# Patient Record
Sex: Female | Born: 1960 | Race: White | Hispanic: No | Marital: Married | Smoking: Current every day smoker
Health system: Southern US, Community
[De-identification: ages and names within clinical notes are randomized; demographics above are authoritative.]

## PROBLEM LIST (undated history)

## (undated) DIAGNOSIS — K219 Gastro-esophageal reflux disease without esophagitis: Secondary | ICD-10-CM

## (undated) HISTORY — DX: Gastro-esophageal reflux disease without esophagitis: K21.9

## (undated) HISTORY — PX: VARICOSE VEIN SURGERY: SHX832

---

## 1998-06-21 ENCOUNTER — Other Ambulatory Visit: Admission: RE | Admit: 1998-06-21 | Discharge: 1998-06-21 | Payer: Self-pay | Admitting: Obstetrics and Gynecology

## 1999-02-24 ENCOUNTER — Emergency Department (HOSPITAL_COMMUNITY): Admission: EM | Admit: 1999-02-24 | Discharge: 1999-02-24 | Payer: Self-pay | Admitting: Emergency Medicine

## 1999-09-06 ENCOUNTER — Other Ambulatory Visit: Admission: RE | Admit: 1999-09-06 | Discharge: 1999-09-06 | Payer: Self-pay | Admitting: Obstetrics and Gynecology

## 2004-06-26 LAB — CONVERTED CEMR LAB: Pap Smear: NORMAL

## 2006-03-26 ENCOUNTER — Encounter: Payer: Self-pay | Admitting: Emergency Medicine

## 2007-12-17 ENCOUNTER — Ambulatory Visit: Payer: Self-pay | Admitting: Family Medicine

## 2007-12-17 DIAGNOSIS — Z716 Tobacco abuse counseling: Secondary | ICD-10-CM | POA: Insufficient documentation

## 2007-12-17 DIAGNOSIS — F17200 Nicotine dependence, unspecified, uncomplicated: Secondary | ICD-10-CM

## 2007-12-17 DIAGNOSIS — K219 Gastro-esophageal reflux disease without esophagitis: Secondary | ICD-10-CM | POA: Insufficient documentation

## 2007-12-30 ENCOUNTER — Encounter: Payer: Self-pay | Admitting: Family Medicine

## 2007-12-30 ENCOUNTER — Other Ambulatory Visit: Admission: RE | Admit: 2007-12-30 | Discharge: 2007-12-30 | Payer: Self-pay | Admitting: Family Medicine

## 2007-12-30 ENCOUNTER — Ambulatory Visit: Payer: Self-pay | Admitting: Family Medicine

## 2007-12-30 DIAGNOSIS — I8390 Asymptomatic varicose veins of unspecified lower extremity: Secondary | ICD-10-CM

## 2007-12-31 LAB — CONVERTED CEMR LAB
ALT: 16 units/L (ref 0–35)
AST: 19 units/L (ref 0–37)
Albumin: 3.6 g/dL (ref 3.5–5.2)
Alkaline Phosphatase: 62 units/L (ref 39–117)
BUN: 13 mg/dL (ref 6–23)
Basophils Absolute: 0 10*3/uL (ref 0.0–0.1)
Basophils Relative: 0.3 % (ref 0.0–1.0)
Bilirubin, Direct: 0.1 mg/dL (ref 0.0–0.3)
CO2: 28 meq/L (ref 19–32)
Calcium: 9.3 mg/dL (ref 8.4–10.5)
Chloride: 106 meq/L (ref 96–112)
Cholesterol: 191 mg/dL (ref 0–200)
Creatinine, Ser: 0.8 mg/dL (ref 0.4–1.2)
Eosinophils Absolute: 0.7 10*3/uL — ABNORMAL HIGH (ref 0.0–0.6)
Eosinophils Relative: 6.7 % — ABNORMAL HIGH (ref 0.0–5.0)
GFR calc Af Amer: 98 mL/min
GFR calc non Af Amer: 81 mL/min
Glucose, Bld: 103 mg/dL — ABNORMAL HIGH (ref 70–99)
HCT: 43.2 % (ref 36.0–46.0)
HDL: 52.4 mg/dL (ref >39.0)
Hemoglobin: 14.2 g/dL (ref 12.0–15.0)
LDL Cholesterol: 128 mg/dL — ABNORMAL HIGH (ref 0–99)
Lymphocytes Relative: 29 % (ref 12.0–46.0)
MCHC: 32.9 g/dL (ref 30.0–36.0)
MCV: 89.9 fL (ref 78.0–100.0)
Monocytes Absolute: 0.7 10*3/uL (ref 0.2–0.7)
Monocytes Relative: 6.6 % (ref 3.0–11.0)
Neutro Abs: 5.9 10*3/uL (ref 1.4–7.7)
Neutrophils Relative %: 57.4 % (ref 43.0–77.0)
Platelets: 268 10*3/uL (ref 150–400)
Potassium: 4 meq/L (ref 3.5–5.1)
RBC: 4.81 M/uL (ref 3.87–5.11)
RDW: 12.7 % (ref 11.5–14.6)
Sodium: 140 meq/L (ref 135–145)
TSH: 1.49 microintl units/mL (ref 0.35–5.50)
Total Bilirubin: 0.7 mg/dL (ref 0.3–1.2)
Total CHOL/HDL Ratio: 3.6
Total Protein: 6.2 g/dL (ref 6.0–8.3)
Triglycerides: 52 mg/dL (ref 0–149)
VLDL: 10 mg/dL (ref 0–40)
WBC: 10.3 10*3/uL (ref 4.5–10.5)

## 2008-01-04 ENCOUNTER — Encounter (INDEPENDENT_AMBULATORY_CARE_PROVIDER_SITE_OTHER): Payer: Self-pay | Admitting: *Deleted

## 2008-01-04 LAB — CONVERTED CEMR LAB: Pap Smear: NORMAL

## 2008-06-15 ENCOUNTER — Encounter: Payer: Self-pay | Admitting: Family Medicine

## 2008-12-12 ENCOUNTER — Encounter: Payer: Self-pay | Admitting: Family Medicine

## 2009-12-21 ENCOUNTER — Encounter: Payer: Self-pay | Admitting: Family Medicine

## 2010-12-25 NOTE — Letter (Signed)
Summary: [facility]  [facility]   Imported By: Lanelle Bal 12/27/2009 14:17:11  _____________________________________________________________________  External Attachment:    Type:   Image     Comment:   External Document

## 2017-01-01 ENCOUNTER — Ambulatory Visit (INDEPENDENT_AMBULATORY_CARE_PROVIDER_SITE_OTHER)
Admission: RE | Admit: 2017-01-01 | Discharge: 2017-01-01 | Disposition: A | Discharge status: Home / Self Care | Payer: BC Managed Care – PPO | Source: Ambulatory Visit | Attending: Primary Care | Admitting: Primary Care

## 2017-01-01 ENCOUNTER — Encounter: Payer: Self-pay | Admitting: Primary Care

## 2017-01-01 ENCOUNTER — Ambulatory Visit (INDEPENDENT_AMBULATORY_CARE_PROVIDER_SITE_OTHER): Payer: BC Managed Care – PPO | Admitting: Primary Care

## 2017-01-01 VITALS — BP 148/94 | HR 77 | Temp 98.3°F | Ht 65.5 in | Wt 183.3 lb

## 2017-01-01 DIAGNOSIS — K219 Gastro-esophageal reflux disease without esophagitis: Secondary | ICD-10-CM | POA: Diagnosis not present

## 2017-01-01 DIAGNOSIS — R05 Cough: Secondary | ICD-10-CM | POA: Diagnosis not present

## 2017-01-01 DIAGNOSIS — R030 Elevated blood-pressure reading, without diagnosis of hypertension: Secondary | ICD-10-CM

## 2017-01-01 DIAGNOSIS — I10 Essential (primary) hypertension: Secondary | ICD-10-CM | POA: Insufficient documentation

## 2017-01-01 DIAGNOSIS — R053 Chronic cough: Secondary | ICD-10-CM | POA: Insufficient documentation

## 2017-01-01 DIAGNOSIS — F17200 Nicotine dependence, unspecified, uncomplicated: Secondary | ICD-10-CM

## 2017-01-01 NOTE — Progress Notes (Signed)
Subjective:    Patient ID: Janeece Fitting, female    DOB: 1961/06/15, 56 y.o.   MRN: [id number]  HPI  Ms. Merced is a 56 year old female who presents today to establish care and discuss the problems mentioned below. Will obtain old records.  1) Cough: Current smoker of 1 PPD for the past 40 years. Her cough has been present for the past 6 months. Her cough is mostly throughout the day without cough at night. Her cough is non productive. She denies fevers, sore throat, unexplained weight loss, night sweats, wheezing, shortness of breath (exertion or rest). She takes benadryl several days weekly with improvement. She's tried taking Allegra and Zyrtec without improvement. She denies wheezing, shortness of breath. Her cough is worse with dust, fragrances, foods (crunchy foods), smoking.  2) Elevated Blood Pressure Reading: BP of 148/94 in the clinic today. She checks her BP regularly at home which runs 130's/70's on average. She denies chest pain, dizziness, headaches.  3) GERD: Long history of with symptoms of esophageal burning. She takes Zantac 75 mg once daily, sometimes 1/2 tablet twice daily with improvement in her esophageal burning. She rarely experiences symptoms of esophageal burning while on Zantac.  Review of Systems  Constitutional: Negative for fatigue, fever and unexpected weight change.  HENT: Negative for congestion and sore throat.   Respiratory: Positive for cough. Negative for shortness of breath and wheezing.   Gastrointestinal:       GERD  Allergic/Immunologic: Positive for environmental allergies.       Past Medical History:  Diagnosis Date  . GERD (gastroesophageal reflux disease)      Social History   Social History  . Marital status: Married    Spouse name: N/A  . Number of children: N/A  . Years of education: N/A   Occupational History  . Not on file.   Social History Main Topics  . Smoking status: Current Every Day Smoker    Packs/day: 1.00  .  Smokeless tobacco: Never Used  . Alcohol use Yes  . Drug use: Unknown  . Sexual activity: Not on file   Other Topics Concern  . Not on file   Social History Narrative   Married.   1 child.   Works as a Pharmacist, hospital.   Enjoys farming, shows dogs.    Past Surgical History:  Procedure Laterality Date  . VARICOSE VEIN SURGERY      Family History  Problem Relation Age of Onset  . Melanoma Mother   . Appendicitis Mother     Cancerous  . Pulmonary embolism Father   . Hypertension Father   . Ovarian cancer Maternal Grandmother   . Multiple myeloma Maternal Grandfather     No Known Allergies  No current outpatient prescriptions on file prior to visit.   No current facility-administered medications on file prior to visit.     BP (!) 148/94   Pulse 77   Temp 98.3 F (36.8 C) (Oral)   Ht 5' 5.5" (1.664 m)   Wt 183 lb 4.8 oz (83.1 kg)   SpO2 97%   BMI 30.04 kg/m    Objective:   Physical Exam  Constitutional: She appears well-nourished.  Neck: Neck supple.  Cardiovascular: Normal rate and regular rhythm.   Pulmonary/Chest: Effort normal and breath sounds normal. She has no wheezes. She has no rales.  Skin: Skin is warm and dry.  Psychiatric: She has a normal mood and affect.  Assessment & Plan:

## 2017-01-01 NOTE — Assessment & Plan Note (Signed)
Overall controlled on Zantac 75 mg, could be contributing to chronic cough. Will have her increase to 150 mg daily. Will call in one month for update on cough.

## 2017-01-01 NOTE — Patient Instructions (Signed)
Increase Zantac to 150 mg once daily for acid reflux.   Consider starting fexofenadine (Allegra) tablets for allergies. Take this daily.  Complete xray(s) prior to leaving today. I will notify you of your results once received.  We will check on you in 1 month for an update. Please call sooner if your symptoms persist.  It was a pleasure to meet you today! Please don't hesitate to call me with any questions. Welcome to [facility]!

## 2017-01-01 NOTE — Progress Notes (Signed)
Pre visit review using our clinic review tool, if applicable. No additional management support is needed unless otherwise documented below in the visit note. 

## 2017-01-01 NOTE — Assessment & Plan Note (Signed)
Smoker of 1 PPD for the past 40 years. Not ready to quit.

## 2017-01-01 NOTE — Assessment & Plan Note (Signed)
Above goal in office today, home readings stable. Continue to monitor.

## 2017-01-01 NOTE — Assessment & Plan Note (Signed)
Ongoing x 6 months. No respiratory symptoms of dyspnea, congestion, wheezing. Exam today unremarkable.  Symptoms could be related to three things: COPD/emphysema, environmental allergies, GERD. Will increase Zantac to 150 mg. Chest xray pending. Will have her take benadryl daily.  Will call in 1 month for an update. If no improvement then consider PFT's.

## 2017-01-29 ENCOUNTER — Telehealth: Payer: Self-pay | Admitting: Primary Care

## 2017-01-29 NOTE — Telephone Encounter (Signed)
Message left for patient to return my call.  

## 2017-01-29 NOTE — Telephone Encounter (Signed)
-----   Message from Doreene NestKatherine K Clark, NP sent at 01/01/2017  3:53 PM EST ----- Regarding: Cough Any improvement in cough since increasing Zantac to 150 mg and taking benadryl daily?

## 2017-02-04 NOTE — Telephone Encounter (Signed)
Message left for patient to return my call.  

## 2019-07-02 ENCOUNTER — Other Ambulatory Visit: Payer: Self-pay

## 2019-07-02 ENCOUNTER — Emergency Department
Admission: EM | Admit: 2019-07-02 | Discharge: 2019-07-02 | Disposition: A | Discharge status: Home / Self Care | Payer: BC Managed Care – PPO | Attending: Emergency Medicine | Admitting: Emergency Medicine

## 2019-07-02 DIAGNOSIS — I10 Essential (primary) hypertension: Secondary | ICD-10-CM | POA: Diagnosis not present

## 2019-07-02 DIAGNOSIS — F17200 Nicotine dependence, unspecified, uncomplicated: Secondary | ICD-10-CM | POA: Insufficient documentation

## 2019-07-02 DIAGNOSIS — R030 Elevated blood-pressure reading, without diagnosis of hypertension: Secondary | ICD-10-CM | POA: Diagnosis present

## 2019-07-02 MED ORDER — AMLODIPINE BESYLATE 5 MG PO TABS
5.0000 mg | ORAL_TABLET | Freq: Once | ORAL | Status: AC
  Administered 2019-07-02: 5 mg via ORAL
  Filled 2019-07-02: qty 1

## 2019-07-02 MED ORDER — AMLODIPINE BESYLATE 5 MG PO TABS: 5.0000 mg | ORAL_TABLET | Freq: Every day | ORAL | 3 refills | Status: DC

## 2019-07-02 NOTE — ED Provider Notes (Signed)
[facility] Emergency Department Provider Note  ____________________________________________  Time seen: Approximately 7:15 PM  I have reviewed the triage vital signs and the nursing notes.   HISTORY  Chief Complaint Hypertension    HPI Jenna Graham is a 58 y.o. female with a history of GERD, presents to the emergency department [facility] due to hypertension noted at triage.  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  Patient denies headache, chest pain, chest tightness, shortness of breath or abdominal pain.  Patient states that she walks daily and tries to maintain a healthy lifestyle.  Patient states that she is not here for basic labs and would like to be started on blood pressure medication.  She has good follow-up with primary care.  No other alleviating measures have been attempted.        Past Medical History:  Diagnosis Date  . GERD (gastroesophageal reflux disease)     Patient Active Problem List   Diagnosis Date Noted  . Chronic cough 01/01/2017  . Elevated blood pressure reading 01/01/2017  . TOBACCO USE 12/17/2007  . GERD (gastroesophageal reflux disease) 12/17/2007    Past Surgical History:  Procedure Laterality Date  . VARICOSE VEIN SURGERY      Prior to Admission medications   Medication Sig Start Date End Date Taking? Authorizing Provider  amLODipine (NORVASC) 5 MG tablet Take 1 tablet (5 mg total) by mouth daily. 07/02/19 08/01/19  Lannie Fields, PA-C    Allergies Patient has no known allergies.  Family History  Problem Relation Age of Onset  . Melanoma Mother   . Appendicitis Mother        Cancerous  . Pulmonary embolism Father   . Hypertension Father   . Ovarian cancer Maternal Grandmother   . Multiple myeloma Maternal Grandfather     Social  History Social History   Tobacco Use  . Smoking status: Current Every Day Smoker    Packs/day: 1.00  . Smokeless tobacco: Never Used  Substance Use Topics  . Alcohol use: Yes  . Drug use: Not on file     Review of Systems  Constitutional: No fever/chills Eyes: No visual changes. No discharge ENT: No upper respiratory complaints. Cardiovascular: no chest pain. Respiratory: no cough. No SOB. Gastrointestinal: No abdominal pain.  No nausea, no vomiting.  No diarrhea.  No constipation. Musculoskeletal: Negative for musculoskeletal pain. Skin: Negative for rash, abrasions, lacerations, ecchymosis. Neurological: Negative for headaches, focal weakness or numbness.  ____________________________________________   PHYSICAL EXAM:  VITAL SIGNS: ED Triage Vitals  Enc Vitals Group     BP 07/02/19 1853 (!) 187/84     Pulse Rate 07/02/19 1853 85     Resp 07/02/19 1853 18     Temp 07/02/19 1853 99.3 F (37.4 C)     Temp src --      SpO2 07/02/19 1853 100 %     Weight 07/02/19 1854 200 lb (90.7 kg)     Height 07/02/19 1854 '5\' 6"'  (1.676 m)     Head Circumference --      Peak Flow --      Pain Score 07/02/19 1854 0     Pain Loc --      Pain Edu? --      Excl. in [place]? --  Constitutional: Alert and oriented. Well appearing and in no acute distress. Eyes: Conjunctivae are normal. PERRL. EOMI. Head: Atraumatic. ENT:      Nose: No congestion/rhinnorhea.      Mouth/Throat: Mucous membranes are moist.  Neck: No stridor.  No cervical spine tenderness to palpation.  Cardiovascular: Normal rate, regular rhythm. Normal S1 and S2.  Good peripheral circulation. Respiratory: Normal respiratory effort without tachypnea or retractions. Lungs CTAB. Good air entry to the bases with no decreased or absent breath sounds. Gastrointestinal: Bowel sounds 4 quadrants. Soft and nontender to palpation. No guarding or rigidity. No palpable masses. No distention. No CVA tenderness. Musculoskeletal:  Full range of motion to all extremities. No gross deformities appreciated. Neurologic:  Normal speech and language. No gross focal neurologic deficits are appreciated.  Skin:  Skin is warm, dry and intact. No rash noted. Psychiatric: Mood and affect are normal. Speech and behavior are normal. Patient exhibits appropriate insight and judgement.   ____________________________________________   LABS (all labs ordered are listed, but only abnormal results are displayed)  Labs Reviewed - No data to display ____________________________________________  EKG   ____________________________________________  RADIOLOGY   No results found.  ____________________________________________    PROCEDURES  Procedure(s) performed:    Procedures    Medications  amLODipine (NORVASC) tablet 5 mg (5 mg Oral Given 07/02/19 1917)     ____________________________________________   INITIAL IMPRESSION / ASSESSMENT AND PLAN / ED COURSE  Pertinent labs & imaging results that were available during my care of the patient were reviewed by me and considered in my medical decision making (see chart for details).  Review of the [place] CSRS was performed in accordance of the [place] prior to dispensing any controlled drugs.           Assessment and plan Hypertension 58 year old female presents to the emergency department [facility] regarding [facility].  Patient was noted to be hypertensive at triage.  Vital signs were otherwise reassuring.  Patient declined basic labs in the emergency department.  Patient was given amlodipine in the emergency department and patient's blood pressure trended down.  Patient was discharged with amlodipine and was advised to follow-up with primary care regarding likely essential hypertension.  All patient questions were answered.     ____________________________________________  FINAL CLINICAL IMPRESSION(S) / ED DIAGNOSES  Final diagnoses:   Hypertension, unspecified type      NEW MEDICATIONS STARTED DURING THIS VISIT:  ED Discharge Orders         Ordered    amLODipine (NORVASC) 5 MG tablet  Daily     07/02/19 2021              This chart was dictated using voice recognition software/Dragon. Despite best efforts to proofread, errors can occur which can change the meaning. Any change was purely unintentional.    Karren Cobble 07/02/19 2037    Nance Pear, MD 07/02/19 2043

## 2019-07-02 NOTE — ED Notes (Signed)
Patient has good color, no acute distress.

## 2019-07-02 NOTE — ED Triage Notes (Signed)
Pt comes via POV from [facility] with c/o hypertension. Pt states she went there to get some health forms filled out and her Bp was reading high. Pt states she was then sent here.  Pt denies any fever,chills, N/V, chest pain, blurred vision, SOB, headache or abdominal  pain.  Pt also states her 60 year old mother is living with her at this time and she has become stressed.  [facility] reports BP-218/136, 198/130.  Current BP-187/84

## 2019-08-20 ENCOUNTER — Ambulatory Visit: Payer: BC Managed Care – PPO | Admitting: Primary Care

## 2019-08-20 ENCOUNTER — Other Ambulatory Visit: Payer: Self-pay

## 2019-08-20 VITALS — BP 152/82 | HR 82 | Temp 97.8°F | Ht 66.0 in | Wt 204.8 lb

## 2019-08-20 DIAGNOSIS — I10 Essential (primary) hypertension: Secondary | ICD-10-CM

## 2019-08-20 DIAGNOSIS — Z716 Tobacco abuse counseling: Secondary | ICD-10-CM

## 2019-08-20 DIAGNOSIS — Z23 Encounter for immunization: Secondary | ICD-10-CM | POA: Diagnosis not present

## 2019-08-20 MED ORDER — AMLODIPINE BESYLATE 10 MG PO TABS: 10.0000 mg | ORAL_TABLET | Freq: Every day | ORAL | 0 refills | Status: DC

## 2019-08-20 NOTE — Assessment & Plan Note (Signed)
BP improved at home but still borderline. Increase Amlodipine to 10 mg daily, new Rx sent to pharmacy. We will plan to see her back in a few weeks for CPE and follow up of hypertension.

## 2019-08-20 NOTE — Progress Notes (Signed)
Subjective:    Patient ID: Jenna Graham, female    DOB: 11-18-1961, 58 y.o.   MRN: [id number]  HPI  Jenna Graham is a 58 year old female with a history of GERD and hypertension who presents today for emergency department follow up. She would also like to discuss tobacco cessation.   1) Essential Hypertension: She presented to [facility] ED on 07/02/19, [facility], for hypertension. Patient endorsed increased stress with her family and work. Her blood pressure was noted to be hypertensive that improved with rest. She declined basic lab work during her stay so she was discharged home with Amlodipine 10 mg and was told to follow up with PCP.  She is checking her BP at home and is getting readings of 130's-140's /80's. She denies headaches/dizziness.  She is compliant to her Amlodipine 5 mg daily, needing refills.   BP Readings from Last 3 Encounters:  08/20/19 (!) 152/82  07/02/19 (!) 177/87  01/01/17 (!) 148/94   2) Tobacco Abuse: She has smoked since the age of 8 and is currently smoking 1/2 to 2 PPD. She is ready to quit, wants counseling. She's tried nicotine patches, lozenges, gum which are bothersome, also drinking lemon water, and using meditation which will help her quit for two weeks at a time. Her last cigarette was yesterday. She has never been treated with prescription medication but is reticent to try today. She thinks she can quit on her own as she is motivated.   Review of Systems  Constitutional: Negative for fever.  Respiratory: Negative for cough and shortness of breath.   Cardiovascular: Negative for chest pain.  Neurological: Negative for dizziness and headaches.       Past Medical History:  Diagnosis Date  . GERD (gastroesophageal reflux disease)      Social History   Socioeconomic History  . Marital status: Married    Spouse name: Not on file  . Number of children: Not on file  . Years of education: Not on file  . Highest education level: Not on file   Occupational History  . Not on file  Social Needs  . Financial resource strain: Not on file  . Food insecurity    Worry: Not on file    Inability: Not on file  . Transportation needs    Medical: Not on file    Non-medical: Not on file  Tobacco Use  . Smoking status: Current Every Day Smoker    Packs/day: 1.00  . Smokeless tobacco: Never Used  Substance and Sexual Activity  . Alcohol use: Yes  . Drug use: Not on file  . Sexual activity: Not on file  Lifestyle  . Physical activity    Days per week: Not on file    Minutes per session: Not on file  . Stress: Not on file  Relationships  . Social Herbalist on phone: Not on file    Gets together: Not on file    Attends religious service: Not on file    Active member of club or organization: Not on file    Attends meetings of clubs or organizations: Not on file    Relationship status: Not on file  . Intimate partner violence    Fear of current or ex partner: Not on file    Emotionally abused: Not on file    Physically abused: Not on file    Forced sexual activity: Not on file  Other Topics Concern  . Not  on file  Social History Narrative   Married.   1 child.   Works as a Pharmacist, hospital.   Enjoys farming, shows dogs.    Past Surgical History:  Procedure Laterality Date  . VARICOSE VEIN SURGERY      Family History  Problem Relation Age of Onset  . Melanoma Mother   . Appendicitis Mother        Cancerous  . Pulmonary embolism Father   . Hypertension Father   . Ovarian cancer Maternal Grandmother   . Multiple myeloma Maternal Grandfather     No Known Allergies  Current Outpatient Medications on File Prior to Visit  Medication Sig Dispense Refill  . [DISCONTINUED] amLODipine (NORVASC) 5 MG tablet Take 1 tablet (5 mg total) by mouth daily. 30 tablet 3   No current facility-administered medications on file prior to visit.     BP (!) 152/82   Pulse 82   Temp 97.8 F (36.6 C) (Temporal)   Ht _0  (1.676  m)   Wt 204 lb 12 oz (92.9 kg)   SpO2 98%   BMI 33.05 kg/m    Objective:   Physical Exam  Constitutional: She appears well-nourished.  Neck: Neck supple.  Cardiovascular: Normal rate and regular rhythm.  Respiratory: Effort normal and breath sounds normal.  Skin: Skin is warm and dry.  Psychiatric: She has a normal mood and affect.           Assessment & Plan:

## 2019-08-20 NOTE — Patient Instructions (Signed)
We've increased the dose of your amlodipine to 10 mg. I sent a new prescription to your pharmacy.  You will be contacted regarding your referral for lung cancer screening.  Please let us know if you have not been contacted within one week.   Please schedule a physical with me within the next 1 month. You may also schedule a lab only appointment 3-4 days prior. We will discuss your lab results in detail during your physical.  It was a pleasure to see you today!

## 2019-08-20 NOTE — Assessment & Plan Note (Signed)
Ready to quit, would like to try on her own. Commended her on this choice. We will plan to see her back in a few weeks to follow up on her progress.  Referral placed for lung cancer screening.

## 2019-08-20 NOTE — Addendum Note (Signed)
Addended by: Jacqualin Combes on: 08/20/2019 03:56 PM   Modules accepted: Orders

## 2019-08-25 ENCOUNTER — Telehealth: Payer: Self-pay | Admitting: *Deleted

## 2019-08-25 NOTE — Telephone Encounter (Signed)
Received referral for low dose lung cancer screening CT scan. Message left at phone number listed in EMR for patient to call me back to facilitate scheduling scan.  

## 2019-08-30 ENCOUNTER — Telehealth: Payer: Self-pay | Admitting: *Deleted

## 2019-08-30 NOTE — Telephone Encounter (Signed)
Patient returned call and would like to wait until Feb of 2021 to have lung screening scan.

## 2019-08-30 NOTE — Telephone Encounter (Signed)
Received referral for low dose lung cancer screening CT scan. Message left at phone number listed in EMR for patient to call me back to facilitate scheduling scan.  

## 2019-10-04 ENCOUNTER — Telehealth: Payer: Self-pay

## 2019-10-04 ENCOUNTER — Other Ambulatory Visit: Payer: Self-pay | Admitting: Primary Care

## 2019-10-04 DIAGNOSIS — Z1159 Encounter for screening for other viral diseases: Secondary | ICD-10-CM

## 2019-10-04 DIAGNOSIS — I10 Essential (primary) hypertension: Secondary | ICD-10-CM

## 2019-10-04 NOTE — Telephone Encounter (Signed)
LVM to call clinic, pt needs COVID screen, front door and back lab info 11.9.2020 TLJ 

## 2019-10-07 ENCOUNTER — Other Ambulatory Visit (INDEPENDENT_AMBULATORY_CARE_PROVIDER_SITE_OTHER): Payer: BC Managed Care – PPO

## 2019-10-07 DIAGNOSIS — I10 Essential (primary) hypertension: Secondary | ICD-10-CM

## 2019-10-07 DIAGNOSIS — Z1159 Encounter for screening for other viral diseases: Secondary | ICD-10-CM

## 2019-10-08 LAB — CBC
HCT: 45.8 % (ref 36.0–46.0)
Hemoglobin: 15.2 g/dL — ABNORMAL HIGH (ref 12.0–15.0)
MCHC: 33.2 g/dL (ref 30.0–36.0)
MCV: 88.6 fl (ref 78.0–100.0)
Platelets: 284 10*3/uL (ref 150.0–400.0)
RBC: 5.17 Mil/uL — ABNORMAL HIGH (ref 3.87–5.11)
RDW: 14.3 % (ref 11.5–15.5)
WBC: 11.6 10*3/uL — ABNORMAL HIGH (ref 4.0–10.5)

## 2019-10-08 LAB — HEPATITIS C ANTIBODY
Hepatitis C Ab: NONREACTIVE
SIGNAL TO CUT-OFF: 0.03 (ref <1.00)

## 2019-10-08 LAB — COMPREHENSIVE METABOLIC PANEL
ALT: 15 U/L (ref 0–35)
AST: 16 U/L (ref 0–37)
Albumin: 4.3 g/dL (ref 3.5–5.2)
Alkaline Phosphatase: 96 U/L (ref 39–117)
BUN: 12 mg/dL (ref 6–23)
CO2: 27 mEq/L (ref 19–32)
Calcium: 9.7 mg/dL (ref 8.4–10.5)
Chloride: 104 mEq/L (ref 96–112)
Creatinine, Ser: 0.79 mg/dL (ref 0.40–1.20)
GFR: 74.16 mL/min (ref >60.00)
Glucose, Bld: 97 mg/dL (ref 70–99)
Potassium: 4.3 mEq/L (ref 3.5–5.1)
Sodium: 139 mEq/L (ref 135–145)
Total Bilirubin: 0.5 mg/dL (ref 0.2–1.2)
Total Protein: 7.3 g/dL (ref 6.0–8.3)

## 2019-10-08 LAB — LIPID PANEL
Cholesterol: 236 mg/dL — ABNORMAL HIGH (ref 0–200)
HDL: 60.5 mg/dL (ref >39.00)
LDL Cholesterol: 155 mg/dL — ABNORMAL HIGH (ref 0–99)
NonHDL: 175.77
Total CHOL/HDL Ratio: 4
Triglycerides: 106 mg/dL (ref 0.0–149.0)
VLDL: 21.2 mg/dL (ref 0.0–40.0)

## 2019-10-08 LAB — HEMOGLOBIN A1C: Hgb A1c MFr Bld: 5.8 % (ref 4.6–6.5)

## 2019-10-11 ENCOUNTER — Encounter: Payer: Self-pay | Admitting: Primary Care

## 2019-10-11 ENCOUNTER — Other Ambulatory Visit: Payer: Self-pay

## 2019-10-11 ENCOUNTER — Ambulatory Visit (INDEPENDENT_AMBULATORY_CARE_PROVIDER_SITE_OTHER): Payer: BC Managed Care – PPO | Admitting: Primary Care

## 2019-10-11 ENCOUNTER — Other Ambulatory Visit (HOSPITAL_COMMUNITY)
Admission: RE | Admit: 2019-10-11 | Discharge: 2019-10-11 | Disposition: A | Discharge status: Home / Self Care | Payer: BC Managed Care – PPO | Source: Ambulatory Visit | Attending: Primary Care | Admitting: Primary Care

## 2019-10-11 VITALS — BP 160/90 | HR 92 | Temp 98.6°F | Ht 66.0 in | Wt 202.5 lb

## 2019-10-11 DIAGNOSIS — K219 Gastro-esophageal reflux disease without esophagitis: Secondary | ICD-10-CM

## 2019-10-11 DIAGNOSIS — Z0000 Encounter for general adult medical examination without abnormal findings: Secondary | ICD-10-CM | POA: Diagnosis not present

## 2019-10-11 DIAGNOSIS — Z0001 Encounter for general adult medical examination with abnormal findings: Secondary | ICD-10-CM | POA: Insufficient documentation

## 2019-10-11 DIAGNOSIS — Z23 Encounter for immunization: Secondary | ICD-10-CM

## 2019-10-11 DIAGNOSIS — Z1231 Encounter for screening mammogram for malignant neoplasm of breast: Secondary | ICD-10-CM

## 2019-10-11 DIAGNOSIS — I10 Essential (primary) hypertension: Secondary | ICD-10-CM

## 2019-10-11 DIAGNOSIS — Z124 Encounter for screening for malignant neoplasm of cervix: Secondary | ICD-10-CM | POA: Diagnosis present

## 2019-10-11 DIAGNOSIS — Z1211 Encounter for screening for malignant neoplasm of colon: Secondary | ICD-10-CM

## 2019-10-11 DIAGNOSIS — R7303 Prediabetes: Secondary | ICD-10-CM | POA: Insufficient documentation

## 2019-10-11 DIAGNOSIS — E785 Hyperlipidemia, unspecified: Secondary | ICD-10-CM

## 2019-10-11 DIAGNOSIS — Z716 Tobacco abuse counseling: Secondary | ICD-10-CM

## 2019-10-11 MED ORDER — ROSUVASTATIN CALCIUM 10 MG PO TABS: 10.0000 mg | ORAL_TABLET | Freq: Every evening | ORAL | 0 refills | Status: DC

## 2019-10-11 NOTE — Assessment & Plan Note (Signed)
LDL of 155, ASCVD risk score of 14%. Given lipid levels coupled with hypertension, prediabetes, and tobacco abuse will treat.  Rx for Crestor 10 mg sent to pharmacy. Repeat lipids and LFT's at next visit.

## 2019-10-11 NOTE — Assessment & Plan Note (Addendum)
Will complete lung cancer screening in early 2021 due to insurance purposes.   She continues to smoke but is ready to work on quitting and is cutting back. She does not wish to use Rx aids and is aware of different OTC methods.   We discussed the importance of tobacco cessation and she verbalized understanding.  We spent 5 min discussing tobacco cessation.  We will follow up in 1 month.

## 2019-10-11 NOTE — Progress Notes (Signed)
Subjective:    Patient ID: Jenna Graham, female    DOB: [birth year], 58 y.o.   MRN: [id number]  HPI  Jenna Graham is a 58 year old female who presents today for complete physical.  She was checking her BP at home initially and was getting readings of 130's/80's then her BP cuff broke. She has not BP checked recently.   She continues to smoke.   Immunizations: -Tetanus: She believes she is UTD. -Influenza: Completed this season  -Shingles: Completed Shingrix -Pneumonia: Completed last in 2020  Diet: She endorses a fair diet. Eating mostly home cooked meals. Desserts infrequently. Drinking coffee with cream and sugar, soda, mild sweet tea, wine, mostly water. Exercise: She is walking 1-2 miles daily  Eye exam: Completed in May 2020 Dental exam: Completes every 4 months.  Pap Smear: No recent exam Mammogram: No recent mammogram Colonoscopy: Never completed, opts for Cologuard Hep C Screen: Negative in 2020 Lung Cancer Screening: Pending for 2021  The 10-year ASCVD risk score Mikey Bussing DC Jr., et al., 2013) is: 14.9%   Values used to calculate the score:     Age: 79 years     Sex: Female     Is Non-Hispanic African American: No     Diabetic: No     Tobacco smoker: Yes     Systolic Blood Pressure: 771 mmHg     Is BP treated: Yes     HDL Cholesterol: 60.5 mg/dL     Total Cholesterol: 236 mg/dL   BP Readings from Last 3 Encounters:  10/11/19 (!) 160/90  08/20/19 (!) 152/82  07/02/19 (!) 177/87     Review of Systems  Constitutional: Negative for unexpected weight change.  HENT: Negative for rhinorrhea.   Respiratory: Negative for cough and shortness of breath.   Cardiovascular: Negative for chest pain.  Gastrointestinal: Negative for constipation and diarrhea.  Genitourinary: Negative for difficulty urinating.  Musculoskeletal: Negative for arthralgias and myalgias.  Skin: Negative for rash.  Allergic/Immunologic: Negative for environmental allergies.  Neurological:  Negative for dizziness, numbness and headaches.  Psychiatric/Behavioral:       Daily anxiety, overall manages well on her own.       Past Medical History:  Diagnosis Date  . GERD (gastroesophageal reflux disease)      Social History   Socioeconomic History  . Marital status: Married    Spouse name: Not on file  . Number of children: Not on file  . Years of education: Not on file  . Highest education level: Not on file  Occupational History  . Not on file  Social Needs  . Financial resource strain: Not on file  . Food insecurity    Worry: Not on file    Inability: Not on file  . Transportation needs    Medical: Not on file    Non-medical: Not on file  Tobacco Use  . Smoking status: Current Every Day Smoker    Packs/day: 1.00  . Smokeless tobacco: Never Used  Substance and Sexual Activity  . Alcohol use: Yes  . Drug use: Not on file  . Sexual activity: Not on file  Lifestyle  . Physical activity    Days per week: Not on file    Minutes per session: Not on file  . Stress: Not on file  Relationships  . Social Herbalist on phone: Not on file    Gets together: Not on file    Attends religious service: Not on file  Active member of club or organization: Not on file    Attends meetings of clubs or organizations: Not on file    Relationship status: Not on file  . Intimate partner violence    Fear of current or ex partner: Not on file    Emotionally abused: Not on file    Physically abused: Not on file    Forced sexual activity: Not on file  Other Topics Concern  . Not on file  Social History Narrative   Married.   1 child.   Works as a Pharmacist, hospital.   Enjoys farming, shows dogs.    Past Surgical History:  Procedure Laterality Date  . VARICOSE VEIN SURGERY      Family History  Problem Relation Age of Onset  . Melanoma Mother   . Appendicitis Mother        Cancerous  . Pulmonary embolism Father   . Hypertension Father   . Ovarian cancer  Maternal Grandmother   . Multiple myeloma Maternal Grandfather     No Known Allergies  Current Outpatient Medications on File Prior to Visit  Medication Sig Dispense Refill  . amLODipine (NORVASC) 10 MG tablet Take 1 tablet (10 mg total) by mouth daily. For blood pressure. 90 tablet 0  . Betamethasone Dipropionate 0.05 % EMUL Apply topically daily as needed.    Marland Kitchen esomeprazole (NEXIUM) 20 MG packet Take 20 mg by mouth daily before breakfast.     No current facility-administered medications on file prior to visit.     BP (!) 160/90   Pulse 92   Temp 98.6 F (37 C) (Skin)   Ht '5\' 6"'  (1.676 m)   Wt 202 lb 8 oz (91.9 kg)   BMI 32.68 kg/m    Objective:   Physical Exam  Constitutional: She is oriented to person, place, and time. She appears well-nourished.  HENT:  Right Ear: Tympanic membrane and ear canal normal.  Left Ear: Tympanic membrane and ear canal normal.  Mouth/Throat: Oropharynx is clear and moist.  Eyes: Pupils are equal, round, and reactive to light. EOM are normal.  Neck: Neck supple.  Cardiovascular: Normal rate and regular rhythm.  Respiratory: Effort normal and breath sounds normal.  GI: Soft. Bowel sounds are normal. There is no abdominal tenderness.  Musculoskeletal: Normal range of motion.  Neurological: She is alert and oriented to person, place, and time.  Skin: Skin is warm and dry.  Psychiatric: She has a normal mood and affect.           Assessment & Plan:

## 2019-10-11 NOTE — Assessment & Plan Note (Addendum)
Tetanus due, provided today. Pneumonia, Shingles, influenza UTD. Pap smear due, completed today. Mammogram overdue, ordered. Colon cancer screening due, declines colonoscopy, opts for Cologuard. Encouraged a healthy diet and regular exercise. Exam today stable. Labs reviewed.

## 2019-10-11 NOTE — Assessment & Plan Note (Signed)
Above goal in the office today, even on recheck. She has no recent home readings, did order a new BP cuff and endorses prior home readings were "better".  Discussed to start monitoring home BP and we will have her back in the office in one month for BP check. She will call sooner for BP readings consistently at or above 135/90.

## 2019-10-11 NOTE — Assessment & Plan Note (Signed)
A1C of 5.8 on recent labs. Discussed the importance of a healthy diet and regular exercise in order for weight loss, and to reduce the risk of any potential medical problems. Continue to monitor.

## 2019-10-11 NOTE — Patient Instructions (Addendum)
Start rosuvastatin (Crestor) 10 mg tablets for cholesterol. Take 1 tablet every evening.  Start monitoring your blood pressure daily, around the same time of day, for the next 4 weeks.  Ensure that you have rested for 30 minutes prior to checking your blood pressure. Record your readings and bring them to your next visit.  We will notify your pap results once received.  Complete the Cologuard Kit once received.  Call the breast center to schedule your mammogram.  Continue exercising. You should be getting 150 minutes of moderate intensity exercise weekly.  Continue to work on [employer].  Please schedule a follow up appointment in 1 month for blood pressure check and cholesterol check.  It was a pleasure to see you today!   Preventive Care 10-2 Years Old, Female Preventive care refers to visits with your health care provider and lifestyle choices that can promote health and wellness. This includes:  A yearly physical exam. This may also be called an annual well check.  Regular dental visits and eye exams.  Immunizations.  Screening for certain conditions.  Healthy lifestyle choices, such as eating a healthy diet, getting regular exercise, not using drugs or products that contain nicotine and tobacco, and limiting alcohol use. What can I expect for my preventive care visit? Physical exam Your health care provider will check your:  Height and weight. This may be used to calculate body mass index (BMI), which tells if you are at a healthy weight.  Heart rate and blood pressure.  Skin for abnormal spots. Counseling Your health care provider may ask you questions about your:  Alcohol, tobacco, and drug use.  Emotional well-being.  Home and relationship well-being.  Sexual activity.  Eating habits.  Work and work Statistician.  Method of birth control.  Menstrual cycle.  Pregnancy history. What immunizations do I need?  Influenza (flu) vaccine  This is  recommended every year. Tetanus, diphtheria, and pertussis (Tdap) vaccine  You may need a Td booster every 10 years. Varicella (chickenpox) vaccine  You may need this if you have not been vaccinated. Zoster (shingles) vaccine  You may need this after age 65. Measles, mumps, and rubella (MMR) vaccine  You may need at least one dose of MMR if you were born in 1957 or later. You may also need a second dose. Pneumococcal conjugate (PCV13) vaccine  You may need this if you have certain conditions and were not previously vaccinated. Pneumococcal polysaccharide (PPSV23) vaccine  You may need one or two doses if you smoke cigarettes or if you have certain conditions. Meningococcal conjugate (MenACWY) vaccine  You may need this if you have certain conditions. Hepatitis A vaccine  You may need this if you have certain conditions or if you travel or work in places where you may be exposed to hepatitis A. Hepatitis B vaccine  You may need this if you have certain conditions or if you travel or work in places where you may be exposed to hepatitis B. Haemophilus influenzae type b (Hib) vaccine  You may need this if you have certain conditions. Human papillomavirus (HPV) vaccine  If recommended by your health care provider, you may need three doses over 6 months. You may receive vaccines as individual doses or as more than one vaccine together in one shot (combination vaccines). Talk with your health care provider about the risks and benefits of combination vaccines. What tests do I need? Blood tests  Lipid and cholesterol levels. These may be checked every 5 years,  or more frequently if you are over 78 years old.  Hepatitis C test.  Hepatitis B test. Screening  Lung cancer screening. You may have this screening every year starting at age 36 if you have a 30-pack-year history of smoking and currently smoke or have quit within the past 15 years.  Colorectal cancer screening. All adults  should have this screening starting at age 27 and continuing until age 59. Your health care provider may recommend screening at age 6 if you are at increased risk. You will have tests every 1-10 years, depending on your results and the type of screening test.  Diabetes screening. This is done by checking your blood sugar (glucose) after you have not eaten for a while (fasting). You may have this done every 1-3 years.  Mammogram. This may be done every 1-2 years. Talk with your health care provider about when you should start having regular mammograms. This may depend on whether you have a family history of breast cancer.  BRCA-related cancer screening. This may be done if you have a family history of breast, ovarian, tubal, or peritoneal cancers.  Pelvic exam and Pap test. This may be done every 3 years starting at age 33. Starting at age 56, this may be done every 5 years if you have a Pap test in combination with an HPV test. Other tests  Sexually transmitted disease (STD) testing.  Bone density scan. This is done to screen for osteoporosis. You may have this scan if you are at high risk for osteoporosis. Follow these instructions at home: Eating and drinking  Eat a diet that includes fresh fruits and vegetables, whole grains, lean protein, and low-fat dairy.  Take vitamin and mineral supplements as recommended by your health care provider.  Do not drink alcohol if: ? Your health care provider tells you not to drink. ? You are pregnant, may be pregnant, or are planning to become pregnant.  If you drink alcohol: ? Limit how much you have to 0-1 drink a day. ? Be aware of how much alcohol is in your drink. In the [place] one drink equals one 12 oz bottle of beer (355 mL), one 5 oz glass of wine (148 mL), or one 1 oz glass of hard liquor (44 mL). Lifestyle  Take daily care of your teeth and gums.  Stay active. Exercise for at least 30 minutes on 5 or more days each week.  Do not use  any products that contain nicotine or tobacco, such as cigarettes, e-cigarettes, and chewing tobacco. If you need help quitting, ask your health care provider.  If you are sexually active, practice safe sex. Use a condom or other form of birth control (contraception) in order to prevent pregnancy and STIs (sexually transmitted infections).  If told by your health care provider, take low-dose aspirin daily starting at age 48. What's next?  Visit your health care provider once a year for a well check visit.  Ask your health care provider how often you should have your eyes and teeth checked.  Stay up to date on all vaccines. This information is not intended to replace advice given to you by your health care provider. Make sure you discuss any questions you have with your health care provider. Document Released: 12/08/2015 Document Revised: 07/23/2018 Document Reviewed: 07/23/2018 Elsevier Patient Education  2020 [employer].

## 2019-10-11 NOTE — Assessment & Plan Note (Signed)
Doing well on every other day Nexium, continue same. Discussed triggers of GERD including smoking. She is working to quit.

## 2019-10-13 LAB — CYTOLOGY - PAP
Comment: NEGATIVE
Diagnosis: NEGATIVE
High risk HPV: NEGATIVE

## 2019-10-15 ENCOUNTER — Encounter: Payer: Self-pay | Admitting: *Deleted

## 2019-11-01 ENCOUNTER — Other Ambulatory Visit: Payer: Self-pay | Admitting: Primary Care

## 2019-11-01 DIAGNOSIS — I10 Essential (primary) hypertension: Secondary | ICD-10-CM

## 2019-11-12 ENCOUNTER — Other Ambulatory Visit: Payer: Self-pay

## 2019-11-12 ENCOUNTER — Ambulatory Visit: Payer: BC Managed Care – PPO | Admitting: Primary Care

## 2019-11-12 VITALS — BP 156/94 | HR 90 | Temp 97.7°F | Ht 66.0 in | Wt 203.0 lb

## 2019-11-12 DIAGNOSIS — E785 Hyperlipidemia, unspecified: Secondary | ICD-10-CM

## 2019-11-12 DIAGNOSIS — I10 Essential (primary) hypertension: Secondary | ICD-10-CM

## 2019-11-12 NOTE — Assessment & Plan Note (Signed)
Compliant to Crestor, repeat lipids and LFT's pending.

## 2019-11-12 NOTE — Progress Notes (Signed)
Subjective:    Patient ID: Jenna Graham, female    DOB: Jun 01, 1961, 58 y.o.   MRN: [id number]  HPI  This visit occurred during the SARS-CoV-2 public health emergency.  Safety protocols were in place, including screening questions prior to the visit, additional usage of staff PPE, and extensive cleaning of exam room while observing appropriate contact time as indicated for disinfecting solutions.   Jenna Graham is a 58 year old female with a history of hypertension, prediabetes, hyperlipidemia who presents today for follow up of hypertension and repeat lipids.  She was last evaluated one month ago for her annual exam and her blood pressure was noted to be above goal on several prior visits. She had not been checking readings at home so we asked her to start and return today.  BP Readings from Last 3 Encounters:  11/12/19 (!) 156/94  10/11/19 (!) 160/90  08/20/19 (!) 152/82   Since her last visit she's checking her blood pressure daily and is getting readings of: 136/79, 136/83, 135/78, 132/74, 128/74, 133/75, 122/71, 136/71, 126/76.  Wt Readings from Last 3 Encounters:  11/12/19 203 lb (92.1 kg)  10/11/19 202 lb 8 oz (91.9 kg)  08/20/19 204 lb 12 oz (92.9 kg)     Review of Systems  Eyes: Negative for visual disturbance.  Respiratory: Negative for shortness of breath.   Cardiovascular: Negative for chest pain.  Neurological: Negative for dizziness and headaches.       Past Medical History:  Diagnosis Date  . GERD (gastroesophageal reflux disease)      Social History   Socioeconomic History  . Marital status: Married    Spouse name: Not on file  . Number of children: Not on file  . Years of education: Not on file  . Highest education level: Not on file  Occupational History  . Not on file  Tobacco Use  . Smoking status: Current Every Day Smoker    Packs/day: 1.00  . Smokeless tobacco: Never Used  Substance and Sexual Activity  . Alcohol use: Yes  . Drug use: Not on  file  . Sexual activity: Not on file  Other Topics Concern  . Not on file  Social History Narrative   Married.   1 child.   Works as a Pharmacist, hospital.   Enjoys farming, shows dogs.   Social Determinants of Health   Financial Resource Strain:   . Difficulty of Paying Living Expenses: Not on file  Food Insecurity:   . Worried About Charity fundraiser in the Last Year: Not on file  . Ran Out of Food in the Last Year: Not on file  Transportation Needs:   . Lack of Transportation (Medical): Not on file  . Lack of Transportation (Non-Medical): Not on file  Physical Activity:   . Days of Exercise per Week: Not on file  . Minutes of Exercise per Session: Not on file  Stress:   . Feeling of Stress : Not on file  Social Connections:   . Frequency of Communication with Friends and Family: Not on file  . Frequency of Social Gatherings with Friends and Family: Not on file  . Attends Religious Services: Not on file  . Active Member of Clubs or Organizations: Not on file  . Attends Archivist Meetings: Not on file  . Marital Status: Not on file  Intimate Partner Violence:   . Fear of Current or Ex-Partner: Not on file  . Emotionally Abused: Not on file  .  Physically Abused: Not on file  . Sexually Abused: Not on file    Past Surgical History:  Procedure Laterality Date  . VARICOSE VEIN SURGERY      Family History  Problem Relation Age of Onset  . Melanoma Mother   . Appendicitis Mother        Cancerous  . Pulmonary embolism Father   . Hypertension Father   . Ovarian cancer Maternal Grandmother   . Multiple myeloma Maternal Grandfather     No Known Allergies  Current Outpatient Medications on File Prior to Visit  Medication Sig Dispense Refill  . amLODipine (NORVASC) 10 MG tablet TAKE 1 TABLET BY MOUTH EVERY DAY FOR BLOOD PRESSURE 90 tablet 0  . Betamethasone Dipropionate 0.05 % EMUL Apply topically daily as needed.    Marland Kitchen esomeprazole (NEXIUM) 20 MG packet Take 20 mg  by mouth daily before breakfast.    . rosuvastatin (CRESTOR) 10 MG tablet Take 1 tablet (10 mg total) by mouth every evening. For cholesterol. 90 tablet 0   No current facility-administered medications on file prior to visit.    BP (!) 156/94   Pulse 90   Temp 97.7 F (36.5 C) (Temporal)   Ht _0  (1.676 m)   Wt 203 lb (92.1 kg)   SpO2 98%   BMI 32.77 kg/m    Objective:   Physical Exam  Constitutional: She appears well-nourished.  Cardiovascular: Normal rate and regular rhythm.  Respiratory: Effort normal and breath sounds normal.  Musculoskeletal:     Cervical back: Neck supple.  Skin: Skin is warm and dry.  Psychiatric: She has a normal mood and affect.           Assessment & Plan:

## 2019-11-12 NOTE — Assessment & Plan Note (Addendum)
Office reading slightly improved but above goal. Home readings are better, some borderline high, some normal.  Discussed potential effects of longstanding high blood pressure. Will have her work on lifestyle changes and have her back in 5 months for BP check.

## 2019-11-12 NOTE — Patient Instructions (Signed)
Stop by the lab prior to leaving today. I will notify you of your results once received.   Continue to monitor your blood pressure, notify me if you consistently see readings at or above 135/90.  Please schedule a follow up appointment in 5 months for blood pressure check.  It was a pleasure to see you today!

## 2019-11-13 LAB — HEPATIC FUNCTION PANEL
AG Ratio: 1.5 (calc) (ref 1.0–2.5)
ALT: 14 U/L (ref 6–29)
AST: 16 U/L (ref 10–35)
Albumin: 4.1 g/dL (ref 3.6–5.1)
Alkaline phosphatase (APISO): 95 U/L (ref 37–153)
Bilirubin, Direct: 0.1 mg/dL (ref 0.0–0.2)
Globulin: 2.7 g/dL (calc) (ref 1.9–3.7)
Indirect Bilirubin: 0.2 mg/dL (calc) (ref 0.2–1.2)
Total Bilirubin: 0.3 mg/dL (ref 0.2–1.2)
Total Protein: 6.8 g/dL (ref 6.1–8.1)

## 2019-11-13 LAB — LIPID PANEL
Cholesterol: 165 mg/dL (ref <200)
HDL: 65 mg/dL (ref >=50)
LDL Cholesterol (Calc): 83 mg/dL (calc)
Non-HDL Cholesterol (Calc): 100 mg/dL (calc) (ref <130)
Total CHOL/HDL Ratio: 2.5 (calc) (ref <5.0)
Triglycerides: 83 mg/dL (ref <150)

## 2019-11-15 ENCOUNTER — Encounter: Payer: Self-pay | Admitting: *Deleted

## 2019-12-31 ENCOUNTER — Telehealth: Payer: Self-pay | Admitting: *Deleted

## 2019-12-31 NOTE — Telephone Encounter (Signed)
Received referral for low dose lung cancer screening CT scan. Message left at phone number listed in EMR for patient to call me back to facilitate scheduling scan.  

## 2020-01-01 ENCOUNTER — Other Ambulatory Visit: Payer: Self-pay | Admitting: Primary Care

## 2020-01-01 DIAGNOSIS — E785 Hyperlipidemia, unspecified: Secondary | ICD-10-CM

## 2020-01-03 ENCOUNTER — Other Ambulatory Visit: Payer: Self-pay

## 2020-01-03 DIAGNOSIS — E785 Hyperlipidemia, unspecified: Secondary | ICD-10-CM

## 2020-01-03 MED ORDER — ROSUVASTATIN CALCIUM 10 MG PO TABS: 10.0000 mg | ORAL_TABLET | Freq: Every evening | ORAL | 1 refills | Status: DC

## 2020-01-03 NOTE — Telephone Encounter (Signed)
Jenna Graham, will you take a look? Also needs office visit (can definitely be virtual) to discuss as this seems like FMLA.

## 2020-01-03 NOTE — Telephone Encounter (Signed)
Appointment 2/10 

## 2020-01-05 ENCOUNTER — Other Ambulatory Visit: Payer: Self-pay

## 2020-01-05 ENCOUNTER — Ambulatory Visit: Payer: BC Managed Care – PPO | Admitting: Primary Care

## 2020-01-05 ENCOUNTER — Encounter: Payer: Self-pay | Admitting: Primary Care

## 2020-01-05 DIAGNOSIS — Z0289 Encounter for other administrative examinations: Secondary | ICD-10-CM | POA: Insufficient documentation

## 2020-01-05 NOTE — Assessment & Plan Note (Signed)
Agree to FMLA given medical history and potential complications if she were to contract Covid-19. Forms to be completed and faxed. Approve to start March 8th through June 3rd with ability to work remotely when possible OR through two weeks after second Covid-19 vaccine.

## 2020-01-05 NOTE — Progress Notes (Signed)
Subjective:    Patient ID: Jenna Graham, female    DOB: 1961/05/11, 59 y.o.   MRN: [id number]  HPI  Virtual Visit via Video Note  I connected with Jenna Graham on 01/05/20 at  2:40 PM EST by a video enabled telemedicine application and verified that I am speaking with the correct person using two identifiers.  Location: Patient: Home Provider: Office   I discussed the limitations of evaluation and management by telemedicine and the availability of in person appointments. The patient expressed understanding and agreed to proceed.  History of Present Illness:  Jenna Graham is a 59 year old female with a history of hypertension, GERD, tobacco abuse, chronic cough, hyperlipidemia, prediabetes who presents today to discuss FMLA.  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 She is an active smoker and had a medical history that may contribute to complications of [id number]-22 if she were to contract.  She is requesting to use sick days starting March 8th through June 3rd 2021 OR through two weeks after her first Covid-19 vaccination.  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  She would be willing to return in person two weeks after she receives her first vaccine. She is also willing to work remotely any days the students are working remotely.    Observations/Objective:  Alert and oriented. Appears well, not sickly. No distress. Speaking in complete sentences.   Assessment and Plan:  Agree to FMLA given medical history and potential complications if she were to contract Covid-19. Forms to be completed and faxed. Approve to start March 8th through June 3rd with ability to work remotely when possible OR through two weeks after second Covid-19 vaccine.  Follow Up Instructions:  We  will complete your paperwork as discussed.  It was a pleasure to see you today! Allie Bossier, NP-C    I discussed the assessment and treatment plan with the patient. The patient was provided an opportunity to ask questions and all were answered. The patient agreed with the plan and demonstrated an understanding of the instructions.   The patient was advised to call back or seek an in-person evaluation if the symptoms worsen or if the condition fails to improve as anticipated.    Pleas Koch, NP ;  Review of Systems  Constitutional: Negative for fever.  Respiratory: Negative for shortness of breath.        Chronic cough  Cardiovascular: Negative for chest pain.       Past Medical History:  Diagnosis Date  . GERD (gastroesophageal reflux disease)      Social History   Socioeconomic History  . Marital status: Married    Spouse name: Not on file  . Number of children: Not on file  . Years of education: Not on file  . Highest education level: Not on file  Occupational History  . Not on file  Tobacco Use  . Smoking status: Current Every Day Smoker    Packs/day: 1.00  . Smokeless tobacco: Never Used  Substance and Sexual Activity  . Alcohol use: Yes  . Drug use: Not on file  . Sexual activity: Not on file  Other Topics Concern  . Not on file  Social History Narrative   Married.   1 child.  Works as a Pharmacist, hospital.   Enjoys farming, shows dogs.   Social Determinants of Health   Financial Resource Strain:   . Difficulty of Paying Living Expenses: Not on file  Food Insecurity:   . Worried About Charity fundraiser in the Last Year: Not on file  . Ran Out of Food in the Last Year: Not on file  Transportation Needs:   . Lack of Transportation (Medical): Not on file  . Lack of Transportation (Non-Medical): Not on file  Physical Activity:   . Days of Exercise per Week: Not on file  . Minutes of Exercise per Session: Not on file  Stress:   . Feeling of Stress :  Not on file  Social Connections:   . Frequency of Communication with Friends and Family: Not on file  . Frequency of Social Gatherings with Friends and Family: Not on file  . Attends Religious Services: Not on file  . Active Member of Clubs or Organizations: Not on file  . Attends Archivist Meetings: Not on file  . Marital Status: Not on file  Intimate Partner Violence:   . Fear of Current or Ex-Partner: Not on file  . Emotionally Abused: Not on file  . Physically Abused: Not on file  . Sexually Abused: Not on file    Past Surgical History:  Procedure Laterality Date  . VARICOSE VEIN SURGERY      Family History  Problem Relation Age of Onset  . Melanoma Mother   . Appendicitis Mother        Cancerous  . Pulmonary embolism Father   . Hypertension Father   . Ovarian cancer Maternal Grandmother   . Multiple myeloma Maternal Grandfather     No Known Allergies  Current Outpatient Medications on File Prior to Visit  Medication Sig Dispense Refill  . amLODipine (NORVASC) 10 MG tablet TAKE 1 TABLET BY MOUTH EVERY DAY FOR BLOOD PRESSURE 90 tablet 0  . Betamethasone Dipropionate 0.05 % EMUL Apply topically daily as needed.    Marland Kitchen esomeprazole (NEXIUM) 20 MG packet Take 20 mg by mouth daily before breakfast.    . rosuvastatin (CRESTOR) 10 MG tablet Take 1 tablet (10 mg total) by mouth every evening. For cholesterol. 90 tablet 1   No current facility-administered medications on file prior to visit.    There were no vitals taken for this visit.   Objective:   Physical Exam  Constitutional: She is oriented to person, place, and time. She appears well-nourished.  Respiratory: Effort normal.  Neurological: She is alert and oriented to person, place, and time.  Psychiatric: She has a normal mood and affect.           Assessment & Plan:

## 2020-01-05 NOTE — Patient Instructions (Signed)
We will complete your paperwork as discussed.  It was a pleasure to see you today! Mayra Reel, NP-C

## 2020-01-06 DIAGNOSIS — Z0279 Encounter for issue of other medical certificate: Secondary | ICD-10-CM

## 2020-01-06 NOTE — Telephone Encounter (Signed)
FMLA paperwork in [employer] in box °

## 2020-01-06 NOTE — Telephone Encounter (Signed)
Completed and handed to Robin. °

## 2020-01-06 NOTE — Telephone Encounter (Signed)
Paperwork faxed °

## 2020-01-07 NOTE — Telephone Encounter (Signed)
Copy for scan Copy for pt Sent my chart message letting pt know paperwork has been faxed

## 2020-01-08 ENCOUNTER — Encounter: Payer: Self-pay | Admitting: Primary Care

## 2020-01-12 LAB — COLOGUARD
COLOGUARD: NEGATIVE
Cologuard: NEGATIVE

## 2020-01-14 ENCOUNTER — Encounter: Payer: Self-pay | Admitting: Primary Care

## 2020-01-26 ENCOUNTER — Other Ambulatory Visit: Payer: Self-pay | Admitting: Primary Care

## 2020-01-26 DIAGNOSIS — I10 Essential (primary) hypertension: Secondary | ICD-10-CM

## 2020-02-02 ENCOUNTER — Telehealth: Payer: Self-pay | Admitting: Primary Care

## 2020-02-02 NOTE — Telephone Encounter (Signed)
Patient returned your call  She said you could call anytime, she will be on the road some due to work but will try to answer when you call back. She stated it was okay to leave a message if you could not reach her

## 2020-02-02 NOTE — Telephone Encounter (Signed)
Spoke with patient, also with [name] in Billing.  She will need to sign the form completion form for FMLA, Simple. Form completed and handed to [place] for her signature. [name] is working to remove the charge from her visit on 01/05/20.  Jenna Graham. FMLA paperwork is in Epic already, under media tab.

## 2020-02-14 ENCOUNTER — Telehealth: Payer: Self-pay | Admitting: *Deleted

## 2020-02-14 ENCOUNTER — Encounter: Payer: Self-pay | Admitting: *Deleted

## 2020-02-14 NOTE — Telephone Encounter (Signed)
Received referral for low dose lung cancer screening CT scan. Message left at phone number listed in EMR for patient to call me back to facilitate scheduling scan.  

## 2020-02-22 ENCOUNTER — Telehealth: Payer: Self-pay | Admitting: *Deleted

## 2020-02-22 DIAGNOSIS — Z87891 Personal history of nicotine dependence: Secondary | ICD-10-CM

## 2020-02-22 NOTE — Telephone Encounter (Signed)
Received referral for initial lung cancer screening scan. Contacted patient and obtained smoking history,(current, 44 pack year) as well as answering questions related to screening process. Patient denies signs of lung cancer such as weight loss or hemoptysis. Patient denies comorbidity that would prevent curative treatment if lung cancer were found. Patient is scheduled for shared decision making visit on 03/23/20 and CT on 03/28/20.

## 2020-03-23 ENCOUNTER — Inpatient Hospital Stay: Payer: BC Managed Care – PPO | Attending: Oncology | Admitting: Oncology

## 2020-03-23 DIAGNOSIS — Z87891 Personal history of nicotine dependence: Secondary | ICD-10-CM

## 2020-03-23 NOTE — Progress Notes (Signed)
Virtual Visit via Video Note  I connected with Mrs. Sorto on 03/23/20 at  4:15 PM EDT by a video enabled telemedicine application and verified that I am speaking with the correct person using two identifiers.  Location: Patient: Home Provider: Office   I discussed the limitations of evaluation and management by telemedicine and the availability of in person appointments. The patient expressed understanding and agreed to proceed.  I discussed the assessment and treatment plan with the patient. The patient was provided an opportunity to ask questions and all were answered. The patient agreed with the plan and demonstrated an understanding of the instructions.   The patient was advised to call back or seek an in-person evaluation if the symptoms worsen or if the condition fails to improve as anticipated.   In accordance with CMS guidelines, patient has met eligibility criteria including age, absence of signs or symptoms of lung cancer.  Social History   Tobacco Use  . Smoking status: Current Every Day Smoker    Packs/day: 1.00  . Smokeless tobacco: Never Used  Substance Use Topics  . Alcohol use: Yes  . Drug use: Not on file      A shared decision-making session was conducted prior to the performance of CT scan. This includes one or more decision aids, includes benefits and harms of screening, follow-up diagnostic testing, over-diagnosis, false positive rate, and total radiation exposure.   Counseling on the importance of adherence to annual lung cancer LDCT screening, impact of co-morbidities, and ability or willingness to undergo diagnosis and treatment is imperative for compliance of the program.   Counseling on the importance of continued smoking cessation for former smokers; the importance of smoking cessation for current smokers, and information about tobacco cessation interventions have been given to patient including Edie and 1800 quit [place] programs.   Written  order for lung cancer screening with LDCT has been given to the patient and any and all questions have been answered to the best of my abilities.    Yearly follow up will be coordinated by Burgess Estelle, Thoracic Navigator.  I provided 15 minutes of face-to-face video visit time during this encounter, and > 50% was spent counseling as documented under my assessment & plan.   Jacquelin Hawking, NP

## 2020-03-27 ENCOUNTER — Telehealth: Payer: Self-pay

## 2020-03-27 NOTE — Telephone Encounter (Signed)
Patient informed of low dose lung cancer screening CT scan appointment on 03/28/20 @ 4:30.

## 2020-03-28 ENCOUNTER — Other Ambulatory Visit: Payer: Self-pay

## 2020-03-28 ENCOUNTER — Ambulatory Visit
Admission: RE | Admit: 2020-03-28 | Discharge: 2020-03-28 | Disposition: A | Discharge status: Home / Self Care | Payer: BC Managed Care – PPO | Source: Ambulatory Visit | Attending: Nurse Practitioner | Admitting: Nurse Practitioner

## 2020-03-28 DIAGNOSIS — Z87891 Personal history of nicotine dependence: Secondary | ICD-10-CM | POA: Insufficient documentation

## 2020-03-30 ENCOUNTER — Encounter: Payer: Self-pay | Admitting: *Deleted

## 2020-04-11 ENCOUNTER — Ambulatory Visit: Payer: BC Managed Care – PPO | Admitting: Primary Care

## 2020-04-11 ENCOUNTER — Other Ambulatory Visit: Payer: Self-pay

## 2020-04-11 ENCOUNTER — Encounter: Payer: Self-pay | Admitting: Primary Care

## 2020-04-11 DIAGNOSIS — R053 Chronic cough: Secondary | ICD-10-CM

## 2020-04-11 DIAGNOSIS — K219 Gastro-esophageal reflux disease without esophagitis: Secondary | ICD-10-CM

## 2020-04-11 DIAGNOSIS — Z716 Tobacco abuse counseling: Secondary | ICD-10-CM

## 2020-04-11 DIAGNOSIS — I10 Essential (primary) hypertension: Secondary | ICD-10-CM

## 2020-04-11 DIAGNOSIS — R05 Cough: Secondary | ICD-10-CM

## 2020-04-11 MED ORDER — AMLODIPINE BESYLATE 10 MG PO TABS: ORAL_TABLET | ORAL | 1 refills | Status: DC

## 2020-04-11 NOTE — Assessment & Plan Note (Signed)
Improved, continue Nexium. Consider LABA/ICS if needed given emphysema.

## 2020-04-11 NOTE — Assessment & Plan Note (Signed)
Well controlled on Nexium. Continue same.

## 2020-04-11 NOTE — Assessment & Plan Note (Signed)
Above goal in the office today, home readings within normal range. Continue amlodipine 10 mg. Refills provided.

## 2020-04-11 NOTE — Progress Notes (Signed)
Subjective:    Patient ID: Jenna Graham, female    DOB: [birth year], 59 y.o.   MRN: [id number]  HPI  This visit occurred during the SARS-CoV-2 public health emergency.  Safety protocols were in place, including screening questions prior to the visit, additional usage of staff PPE, and extensive cleaning of exam room while observing appropriate contact time as indicated for disinfecting solutions.   Jenna Graham is a 59 year old female with a history of hypertension, GERD, tobacco abuse, hyperlipidemia, prediabetes who presents today for follow up of hypertension.  She was last evaluated in December 2020, BP noted to be elevated on that visit and also in November 2020. She is managed on Amlodipine 10 mg daily and is compliant daily.  She is checking her BP at home and is getting readings of 120's/70's most of the time. She denies chest pain, dizziness, headaches.   She is working on tobacco cessation and has cut down on cigarette use. She is also starting to count calories in an attempt for weight loss. She underwent lung cancer screening in May 2021 which showed aortic atherosclerosis and emphysema. Her chronic cough has improved.   BP Readings from Last 3 Encounters:  04/11/20 (!) 148/76  11/12/19 (!) 156/94  10/11/19 (!) 160/90   Wt Readings from Last 3 Encounters:  04/11/20 201 lb 12 oz (91.5 kg)  03/28/20 203 lb (92.1 kg)  11/12/19 203 lb (92.1 kg)     Review of Systems  Eyes: Negative for visual disturbance.  Respiratory: Negative for shortness of breath.        Chronic cough has improved   Cardiovascular: Negative for chest pain.  Neurological: Negative for dizziness and headaches.       Past Medical History:  Diagnosis Date  . GERD (gastroesophageal reflux disease)      Social History   Socioeconomic History  . Marital status: Married    Spouse name: Not on file  . Number of children: Not on file  . Years of education: Not on file  . Highest education level: Not  on file  Occupational History  . Not on file  Tobacco Use  . Smoking status: Current Every Day Smoker    Packs/day: 1.00  . Smokeless tobacco: Never Used  Substance and Sexual Activity  . Alcohol use: Yes  . Drug use: Not on file  . Sexual activity: Not on file  Other Topics Concern  . Not on file  Social History Narrative   Married.   1 child.   Works as a Pharmacist, hospital.   Enjoys farming, shows dogs.   Social Determinants of Health   Financial Resource Strain:   . Difficulty of Paying Living Expenses:   Food Insecurity:   . Worried About Charity fundraiser in the Last Year:   . Arboriculturist in the Last Year:   Transportation Needs:   . Film/video editor (Medical):   Marland Kitchen Lack of Transportation (Non-Medical):   Physical Activity:   . Days of Exercise per Week:   . Minutes of Exercise per Session:   Stress:   . Feeling of Stress :   Social Connections:   . Frequency of Communication with Friends and Family:   . Frequency of Social Gatherings with Friends and Family:   . Attends Religious Services:   . Active Member of Clubs or Organizations:   . Attends Archivist Meetings:   Marland Kitchen Marital Status:   Intimate Partner Violence:   .  Fear of Current or Ex-Partner:   . Emotionally Abused:   Marland Kitchen Physically Abused:   . Sexually Abused:     Past Surgical History:  Procedure Laterality Date  . VARICOSE VEIN SURGERY      Family History  Problem Relation Age of Onset  . Melanoma Mother   . Appendicitis Mother        Cancerous  . Pulmonary embolism Father   . Hypertension Father   . Ovarian cancer Maternal Grandmother   . Multiple myeloma Maternal Grandfather     No Known Allergies  Current Outpatient Medications on File Prior to Visit  Medication Sig Dispense Refill  . Betamethasone Dipropionate 0.05 % EMUL Apply topically daily as needed.    Marland Kitchen esomeprazole (NEXIUM) 20 MG packet Take 20 mg by mouth daily before breakfast.    . rosuvastatin (CRESTOR) 10 MG  tablet Take 1 tablet (10 mg total) by mouth every evening. For cholesterol. 90 tablet 1   No current facility-administered medications on file prior to visit.    BP (!) 148/76   Pulse 80   Temp (!) 95.6 F (35.3 C) (Temporal)   Ht '5\' 6"'  (1.676 m)   Wt 201 lb 12 oz (91.5 kg)   SpO2 98%   BMI 32.56 kg/m    Objective:   Physical Exam  Constitutional: She appears well-nourished.  Cardiovascular: Normal rate and regular rhythm.  Respiratory: Effort normal and breath sounds normal.  Musculoskeletal:     Cervical back: Neck supple.  Skin: Skin is warm and dry.  Psychiatric: She has a normal mood and affect.           Assessment & Plan:

## 2020-04-11 NOTE — Assessment & Plan Note (Signed)
Commended her on cutting back, challenged her to quit this Summer.

## 2020-04-11 NOTE — Patient Instructions (Signed)
Continue amlodipine 10 mg once daily for blood pressure.  Work on tobacco cessation.  Consider reducing calories to 1200 daily.   Please schedule a physical with me for November or December 2021.  It was a pleasure to see you today!

## 2020-06-27 ENCOUNTER — Other Ambulatory Visit: Payer: Self-pay | Admitting: Primary Care

## 2020-06-27 DIAGNOSIS — E785 Hyperlipidemia, unspecified: Secondary | ICD-10-CM

## 2020-09-25 ENCOUNTER — Other Ambulatory Visit: Payer: Self-pay | Admitting: Primary Care

## 2020-09-25 DIAGNOSIS — E785 Hyperlipidemia, unspecified: Secondary | ICD-10-CM

## 2020-10-13 ENCOUNTER — Other Ambulatory Visit: Payer: Self-pay | Admitting: Primary Care

## 2020-10-13 DIAGNOSIS — I10 Essential (primary) hypertension: Secondary | ICD-10-CM

## 2020-11-09 ENCOUNTER — Other Ambulatory Visit: Payer: Self-pay | Admitting: Primary Care

## 2020-11-09 DIAGNOSIS — I10 Essential (primary) hypertension: Secondary | ICD-10-CM

## 2020-12-26 ENCOUNTER — Encounter: Payer: Self-pay | Admitting: Primary Care

## 2020-12-26 ENCOUNTER — Other Ambulatory Visit: Payer: Self-pay

## 2020-12-26 ENCOUNTER — Ambulatory Visit: Payer: BC Managed Care – PPO | Admitting: Primary Care

## 2020-12-26 VITALS — BP 140/86 | HR 87 | Temp 97.5°F

## 2020-12-26 DIAGNOSIS — E785 Hyperlipidemia, unspecified: Secondary | ICD-10-CM

## 2020-12-26 DIAGNOSIS — I10 Essential (primary) hypertension: Secondary | ICD-10-CM

## 2020-12-26 DIAGNOSIS — Z1231 Encounter for screening mammogram for malignant neoplasm of breast: Secondary | ICD-10-CM

## 2020-12-26 DIAGNOSIS — L409 Psoriasis, unspecified: Secondary | ICD-10-CM | POA: Diagnosis not present

## 2020-12-26 DIAGNOSIS — R053 Chronic cough: Secondary | ICD-10-CM

## 2020-12-26 DIAGNOSIS — R7303 Prediabetes: Secondary | ICD-10-CM | POA: Diagnosis not present

## 2020-12-26 DIAGNOSIS — K219 Gastro-esophageal reflux disease without esophagitis: Secondary | ICD-10-CM

## 2020-12-26 MED ORDER — AMLODIPINE BESYLATE 10 MG PO TABS: ORAL_TABLET | ORAL | 3 refills | Status: DC

## 2020-12-26 MED ORDER — ROSUVASTATIN CALCIUM 10 MG PO TABS: 10.0000 mg | ORAL_TABLET | Freq: Every evening | ORAL | 3 refills | Status: DC

## 2020-12-26 NOTE — Assessment & Plan Note (Signed)
Significantly improved on Nexium 20 mg, continue same. Continue annual lung cancer screening.

## 2020-12-26 NOTE — Assessment & Plan Note (Signed)
Compliant to rosuvastatin 10 mg, continue same. Repeat lipid panel pending.  

## 2020-12-26 NOTE — Assessment & Plan Note (Signed)
Follows with dermatology, continue PRN betamethasone.

## 2020-12-26 NOTE — Progress Notes (Signed)
Subjective:    Patient ID: Jenna Graham, female    DOB: 02-27-1961, 60 y.o.   MRN: [id number]  HPI  This visit occurred during the SARS-CoV-2 public health emergency.  Safety protocols were in place, including screening questions prior to the visit, additional usage of staff PPE, and extensive cleaning of exam room while observing appropriate contact time as indicated for disinfecting solutions.   Jenna Graham is a 60 year old female with a history of hypertension, GERD, hyperlipidemia, prediabetes who presents today for follow-up and medication refills.  1) Essential Hypertension: Currently managed on amlodipine 10 mg daily. She is checking her blood pressure at home which is running 120's-130's/70's. She denies chest pain, dizziness, headaches.   BP Readings from Last 3 Encounters:  12/26/20 140/86  04/11/20 (!) 148/76  11/12/19 (!) 156/94    2) Hyperlipidemia: Currently managed on rosuvastatin 10 mg daily. Denies concerns today.   3) Chronic Cough and GERD: Improved and not bothersome with Nexium 20 mg. She denies shortness of breath. She completed lung cancer screening in May 2021, due again this year.    Review of Systems  Eyes: Negative for visual disturbance.  Respiratory: Negative for shortness of breath.   Cardiovascular: Negative for chest pain.  Neurological: Negative for dizziness and headaches.       Past Medical History:  Diagnosis Date  . GERD (gastroesophageal reflux disease)      Social History   Socioeconomic History  . Marital status: Married    Spouse name: Not on file  . Number of children: Not on file  . Years of education: Not on file  . Highest education level: Not on file  Occupational History  . Not on file  Tobacco Use  . Smoking status: Current Every Day Smoker    Packs/day: 1.00  . Smokeless tobacco: Never Used  Substance and Sexual Activity  . Alcohol use: Yes  . Drug use: Not on file  . Sexual activity: Not on file  Other Topics  Concern  . Not on file  Social History Narrative   Married.   1 child.   Works as a Pharmacist, hospital.   Enjoys farming, shows dogs.   Social Determinants of Health   Financial Resource Strain: Not on file  Food Insecurity: Not on file  Transportation Needs: Not on file  Physical Activity: Not on file  Stress: Not on file  Social Connections: Not on file  Intimate Partner Violence: Not on file    Past Surgical History:  Procedure Laterality Date  . VARICOSE VEIN SURGERY      Family History  Problem Relation Age of Onset  . Melanoma Mother   . Appendicitis Mother        Cancerous  . Pulmonary embolism Father   . Hypertension Father   . Ovarian cancer Maternal Grandmother   . Multiple myeloma Maternal Grandfather     No Known Allergies  Current Outpatient Medications on File Prior to Visit  Medication Sig Dispense Refill  . Betamethasone Dipropionate 0.05 % EMUL Apply topically daily as needed.    Marland Kitchen esomeprazole (NEXIUM) 20 MG packet Take 20 mg by mouth daily before breakfast.     No current facility-administered medications on file prior to visit.    BP 140/86   Pulse 87   Temp (!) 97.5 F (36.4 C) (Temporal)   SpO2 98%    Objective:   Physical Exam Constitutional:      Appearance: She is well-nourished.  Cardiovascular:  Rate and Rhythm: Normal rate and regular rhythm.  Pulmonary:     Effort: Pulmonary effort is normal.     Breath sounds: Normal breath sounds.  Abdominal:     Palpations: Abdomen is soft.     Tenderness: There is no abdominal tenderness.  Musculoskeletal:     Cervical back: Neck supple.  Skin:    General: Skin is warm and dry.  Psychiatric:        Mood and Affect: Mood and affect and mood normal.            Assessment & Plan:

## 2020-12-26 NOTE — Patient Instructions (Signed)
Stop by the lab prior to leaving today. I will notify you of your results once received.   Call the [facility] to schedule your mammogram.   Complete lung cancer screening later this year.  It was a pleasure to see you today!

## 2020-12-26 NOTE — Assessment & Plan Note (Signed)
Above goal in the office today, compliant to amlodipine 10 mg. Home readings are much better and within normal range most of the time.  Discussed to continue to monitor BP and notify if readings are consistently at or above 130/90.  CMP pending.

## 2020-12-26 NOTE — Assessment & Plan Note (Signed)
Discussed the importance of a healthy diet and regular exercise in order for weight loss, and to reduce the risk of any potential medical problems.  Repeat A1C pending. 

## 2020-12-26 NOTE — Assessment & Plan Note (Signed)
Improved and controlled on Nexium 20 mg, continue same.

## 2020-12-27 LAB — CBC
HCT: 44.5 % (ref 36.0–46.0)
Hemoglobin: 14.8 g/dL (ref 12.0–15.0)
MCHC: 33.3 g/dL (ref 30.0–36.0)
MCV: 86.5 fl (ref 78.0–100.0)
Platelets: 303 10*3/uL (ref 150.0–400.0)
RBC: 5.15 Mil/uL — ABNORMAL HIGH (ref 3.87–5.11)
RDW: 14.5 % (ref 11.5–15.5)
WBC: 11.9 10*3/uL — ABNORMAL HIGH (ref 4.0–10.5)

## 2020-12-27 LAB — COMPREHENSIVE METABOLIC PANEL
ALT: 13 U/L (ref 0–35)
AST: 14 U/L (ref 0–37)
Albumin: 4.1 g/dL (ref 3.5–5.2)
Alkaline Phosphatase: 84 U/L (ref 39–117)
BUN: 15 mg/dL (ref 6–23)
CO2: 28 mEq/L (ref 19–32)
Calcium: 9.8 mg/dL (ref 8.4–10.5)
Chloride: 105 mEq/L (ref 96–112)
Creatinine, Ser: 0.77 mg/dL (ref 0.40–1.20)
GFR: 83.19 mL/min (ref >60.00)
Glucose, Bld: 111 mg/dL — ABNORMAL HIGH (ref 70–99)
Potassium: 3.8 mEq/L (ref 3.5–5.1)
Sodium: 140 mEq/L (ref 135–145)
Total Bilirubin: 0.3 mg/dL (ref 0.2–1.2)
Total Protein: 6.6 g/dL (ref 6.0–8.3)

## 2020-12-27 LAB — LIPID PANEL
Cholesterol: 163 mg/dL (ref 0–200)
HDL: 66.6 mg/dL (ref >39.00)
LDL Cholesterol: 82 mg/dL (ref 0–99)
NonHDL: 96.2
Total CHOL/HDL Ratio: 2
Triglycerides: 70 mg/dL (ref 0.0–149.0)
VLDL: 14 mg/dL (ref 0.0–40.0)

## 2020-12-27 LAB — HEMOGLOBIN A1C: Hgb A1c MFr Bld: 6.1 % (ref 4.6–6.5)

## 2021-04-11 ENCOUNTER — Telehealth: Payer: Self-pay

## 2021-04-11 NOTE — Telephone Encounter (Signed)
Left message for patient to notify them that it is time to schedule annual low dose lung cancer screening CT scan. Instructed patient to call back ([phone]) to verify information and schedule.  

## 2021-06-04 ENCOUNTER — Telehealth: Payer: Self-pay | Admitting: *Deleted

## 2021-06-04 NOTE — Telephone Encounter (Signed)
Patient called me back re: scheduling low dose lung screening CT Scan. She stated that she wants to schedule for sometime in September, and preferably not on a Monday. Told patient that I don't have the September schedule open yet, but will have someone call her to schedule as soon as we have that available.

## 2021-06-04 NOTE — Telephone Encounter (Signed)
Left message for patient to notify them that it is time to schedule annual low dose lung cancer screening CT scan. Instructed patient to call back ([phone]) to verify information and schedule.  

## 2021-06-05 ENCOUNTER — Telehealth: Payer: Self-pay | Admitting: *Deleted

## 2021-06-05 NOTE — Telephone Encounter (Signed)
Attempted to contact and schedule lung screening scan. Message left for patient to call back to schedule. 

## 2021-06-07 ENCOUNTER — Other Ambulatory Visit: Payer: Self-pay | Admitting: *Deleted

## 2021-06-07 DIAGNOSIS — F17200 Nicotine dependence, unspecified, uncomplicated: Secondary | ICD-10-CM

## 2021-06-07 DIAGNOSIS — Z87891 Personal history of nicotine dependence: Secondary | ICD-10-CM

## 2021-06-07 NOTE — Progress Notes (Signed)
Contacted and scheduled for annual lung screening scan. Patient is a current smoker with a 45 pack year history.  

## 2021-08-09 ENCOUNTER — Ambulatory Visit
Admission: RE | Admit: 2021-08-09 | Discharge: 2021-08-09 | Disposition: A | Discharge status: Home / Self Care | Payer: BC Managed Care – PPO | Source: Ambulatory Visit | Attending: Acute Care | Admitting: Acute Care

## 2021-08-09 ENCOUNTER — Other Ambulatory Visit: Payer: Self-pay

## 2021-08-09 DIAGNOSIS — Z87891 Personal history of nicotine dependence: Secondary | ICD-10-CM | POA: Insufficient documentation

## 2021-08-09 DIAGNOSIS — F17200 Nicotine dependence, unspecified, uncomplicated: Secondary | ICD-10-CM | POA: Insufficient documentation

## 2021-08-15 ENCOUNTER — Encounter: Payer: Self-pay | Admitting: *Deleted

## 2021-08-15 DIAGNOSIS — Z87891 Personal history of nicotine dependence: Secondary | ICD-10-CM

## 2021-08-15 DIAGNOSIS — F17200 Nicotine dependence, unspecified, uncomplicated: Secondary | ICD-10-CM

## 2021-12-19 ENCOUNTER — Other Ambulatory Visit: Payer: Self-pay | Admitting: Primary Care

## 2021-12-19 DIAGNOSIS — I10 Essential (primary) hypertension: Secondary | ICD-10-CM

## 2021-12-20 ENCOUNTER — Other Ambulatory Visit: Payer: Self-pay | Admitting: Primary Care

## 2021-12-20 DIAGNOSIS — E785 Hyperlipidemia, unspecified: Secondary | ICD-10-CM

## 2022-01-12 ENCOUNTER — Other Ambulatory Visit: Payer: Self-pay | Admitting: Primary Care

## 2022-01-12 DIAGNOSIS — I10 Essential (primary) hypertension: Secondary | ICD-10-CM

## 2022-01-12 DIAGNOSIS — E785 Hyperlipidemia, unspecified: Secondary | ICD-10-CM

## 2022-01-13 IMAGING — CT CT CHEST LUNG CANCER SCREENING LOW DOSE W/O CM
2 of 5 series · 15 of 40 positions shown, 18 images · non-contrast
Comparison: 03/28/2020

CLINICAL DATA: Lung cancer screening. Current smoker with 45
pack-year history. Currently asymptomatic.

EXAM:
CT CHEST WITHOUT CONTRAST LOW-DOSE FOR LUNG CANCER SCREENING
TECHNIQUE: Multidetector CT imaging of the chest was performed following the
standard protocol without IV contrast.

[Series 3: lung 1.00 · axial · 0.61mm/px · z∈[-1165,-889]mm · 12 of 306 slices shown, 15 images]
[im 15/306  mediastinal]
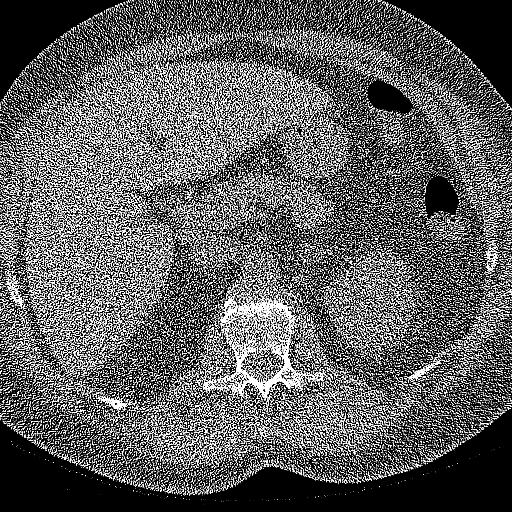
[im 15/306  lung]
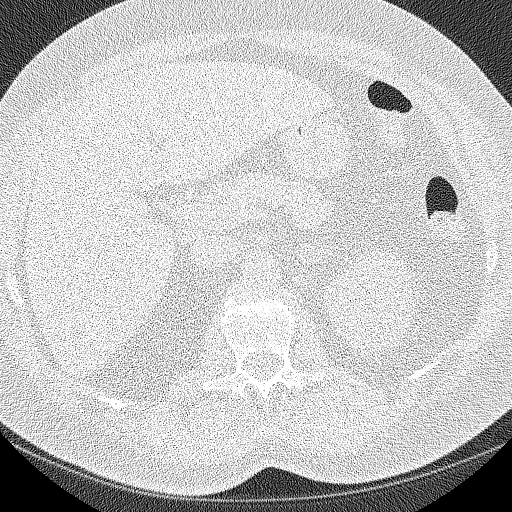
[im 44/306  lung]
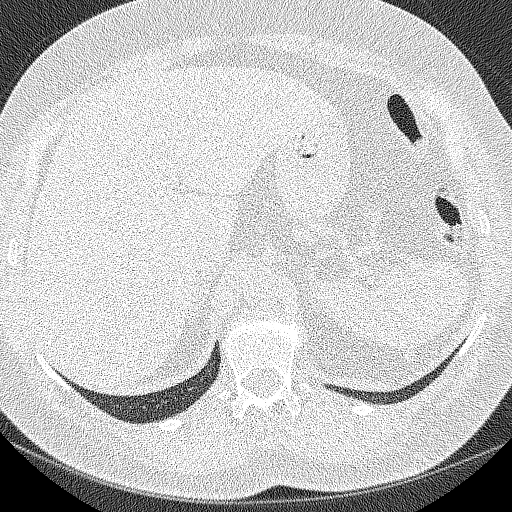
[im 73/306  lung]
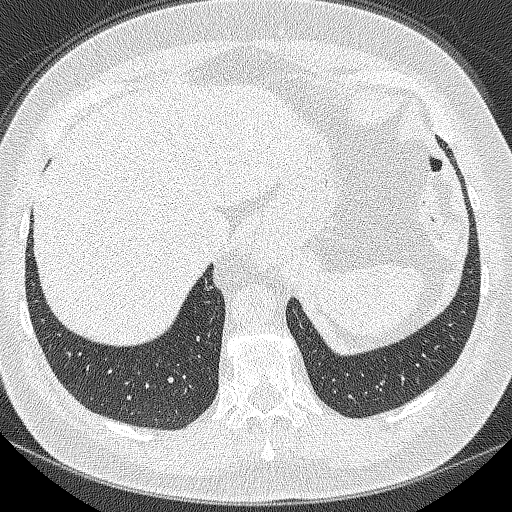
[im 88/306  lung]
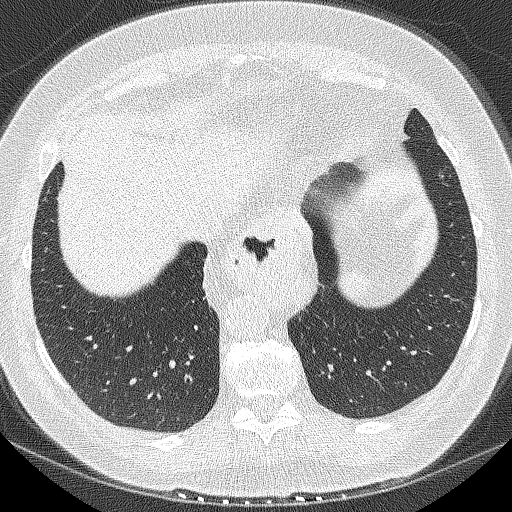
[im 117/306  mediastinal]
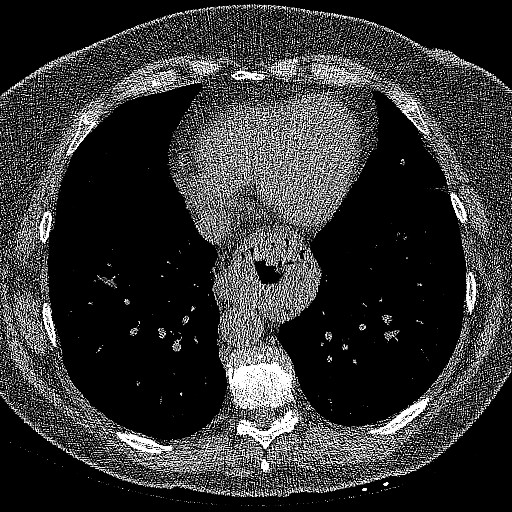
[im 117/306  lung]
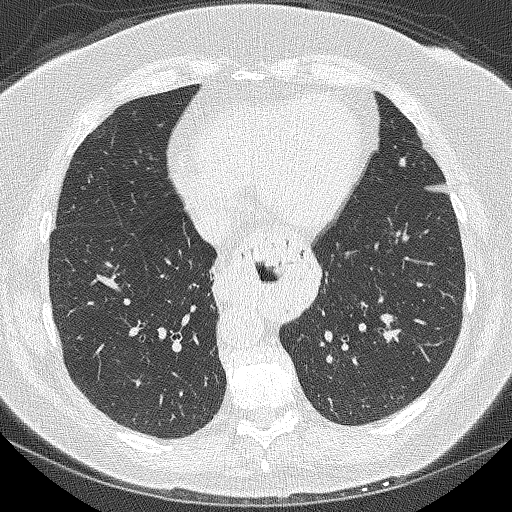
[im 146/306  lung]
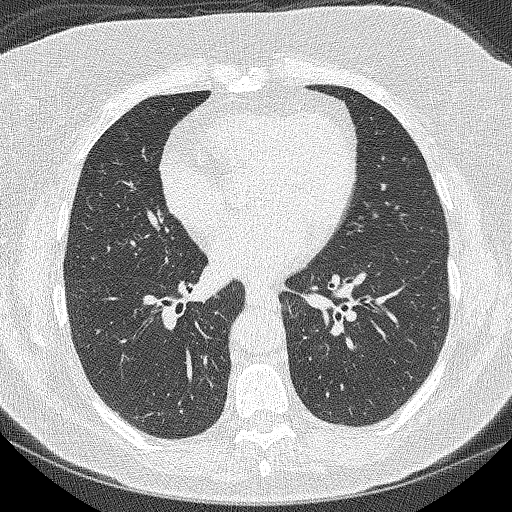
[im 160/306  lung]
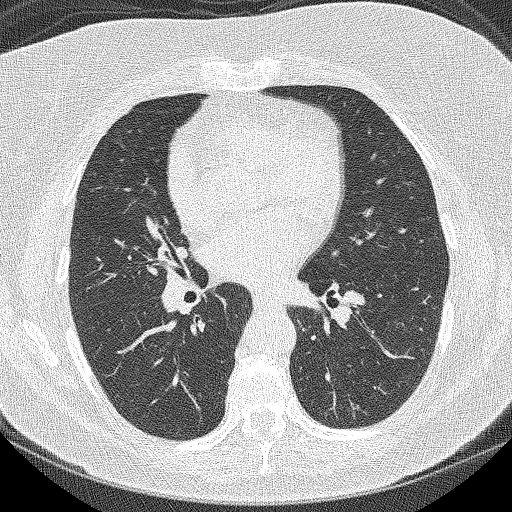
[im 189/306  lung]
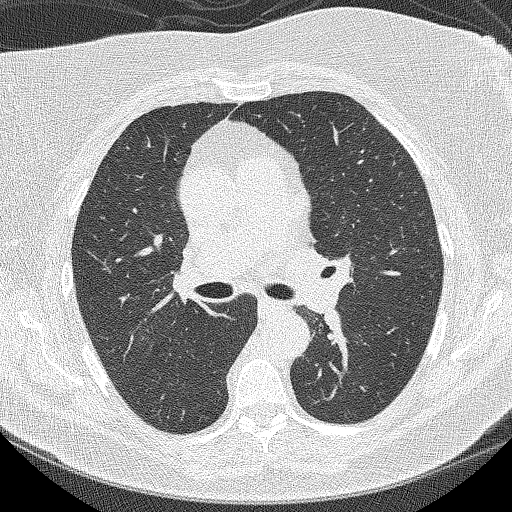
[im 218/306  mediastinal]
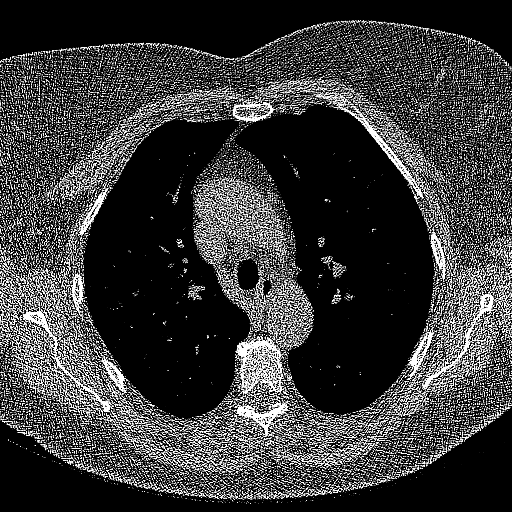
[im 218/306  lung]
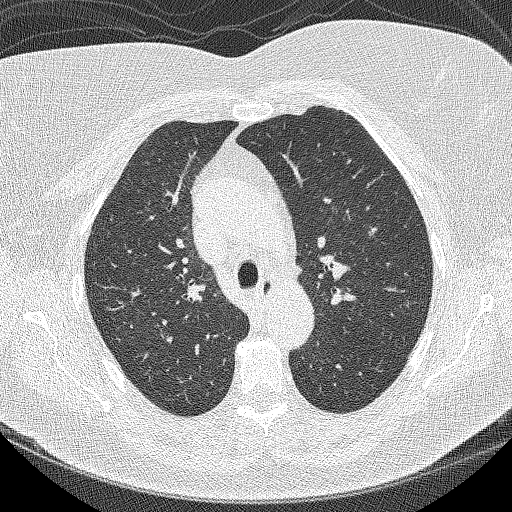
[im 233/306  lung]
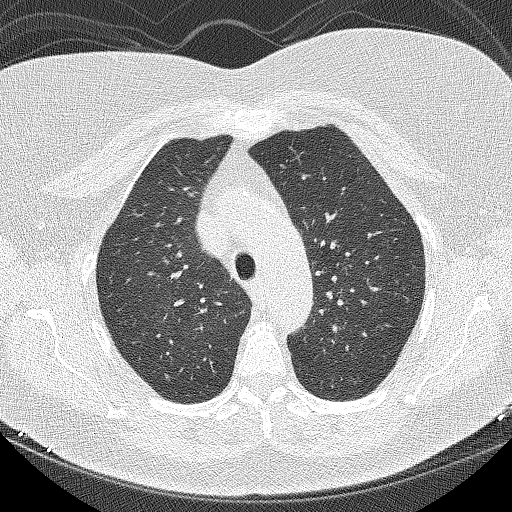
[im 262/306  lung]
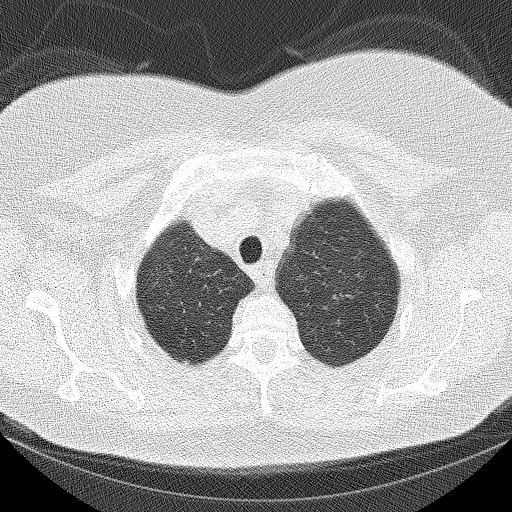
[im 291/306  lung]
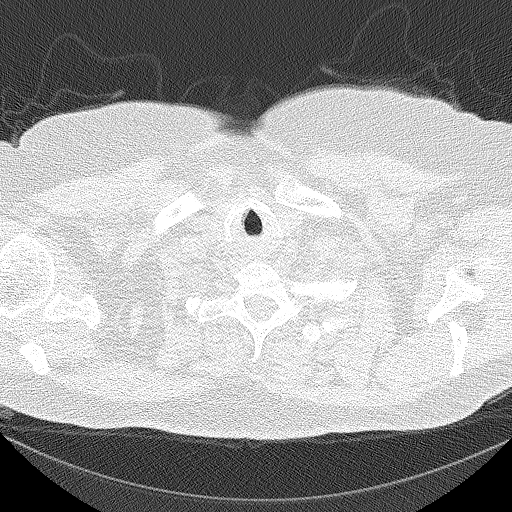

[Series 5: coronals lung 1.00 cor · coronal · 0.60mm/px · 3 of 296 slices shown]
[im 60/296  lung]
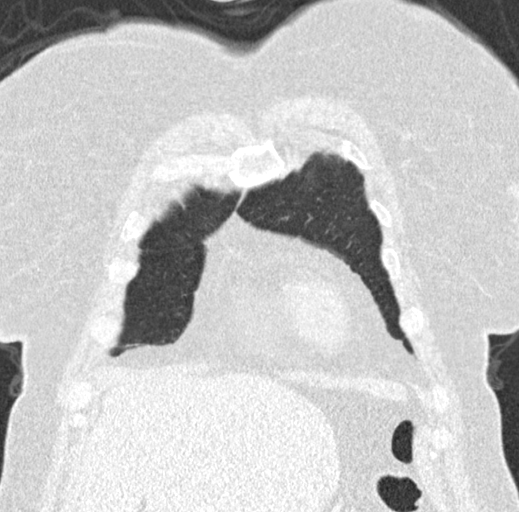
[im 119/296  lung]
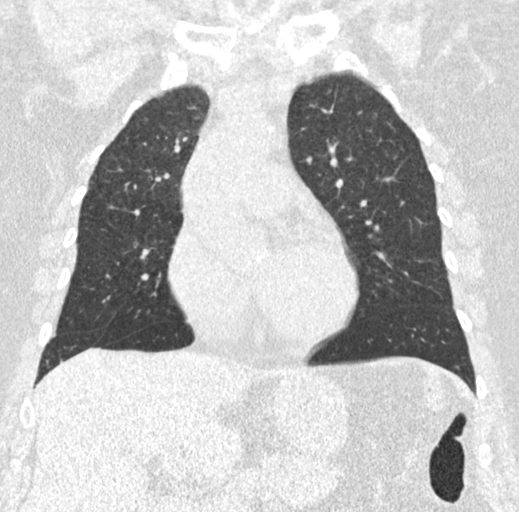
[im 178/296  lung]
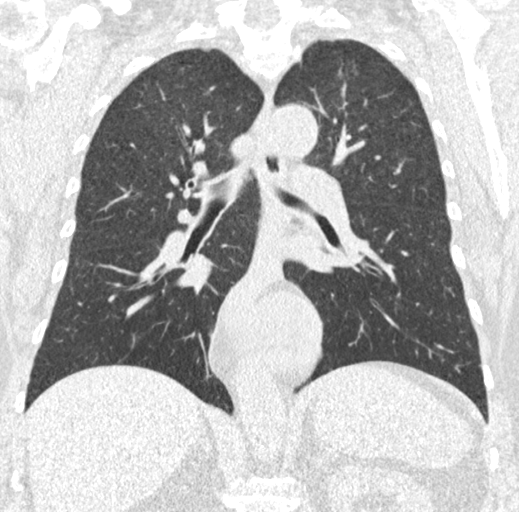

[15 of 40 positions shown; findings below may reference images not displayed]

FINDINGS: Cardiovascular: Heart size is within normal limits. Aortic
atherosclerosis. Coronary artery calcifications.

Mediastinum/Nodes: No enlarged mediastinal, hilar, or axillary lymph
nodes. Thyroid gland, trachea, and esophagus demonstrate no
significant findings.

Lungs/Pleura: Mild centrilobular emphysema with diffuse bronchial
wall thickening. No pleural effusion, airspace consolidation, or
atelectasis. All but one of the previously identified lung nodules
have resolved in the interval. There is a 1.2 mm nodule within the
right middle lobe which remains on today's exam. No suspicious lung
nodules identified.

Upper Abdomen: Moderate size hiatal hernia. No acute abnormality.
Posterior right hepatic lobe cyst is again noted measuring 4 cm.

Musculoskeletal: Degenerative disc disease identified within the
thoracic spine. No acute or suspicious osseous findings.
IMPRESSION: 1. Lung-RADS 2, benign appearance or behavior. Continue annual
screening with low-dose chest CT without contrast in 12 months.
2. Coronary artery calcifications noted.
3. Hiatal hernia.

Aortic Atherosclerosis (F396B-BW6.6) and Emphysema (F396B-TQG.6).

## 2022-01-14 ENCOUNTER — Ambulatory Visit (INDEPENDENT_AMBULATORY_CARE_PROVIDER_SITE_OTHER): Payer: BC Managed Care – PPO | Admitting: Primary Care

## 2022-01-14 ENCOUNTER — Encounter: Payer: Self-pay | Admitting: Primary Care

## 2022-01-14 ENCOUNTER — Other Ambulatory Visit: Payer: Self-pay

## 2022-01-14 VITALS — BP 138/84 | HR 82 | Temp 98.6°F

## 2022-01-14 DIAGNOSIS — I10 Essential (primary) hypertension: Secondary | ICD-10-CM

## 2022-01-14 DIAGNOSIS — N898 Other specified noninflammatory disorders of vagina: Secondary | ICD-10-CM | POA: Insufficient documentation

## 2022-01-14 DIAGNOSIS — K219 Gastro-esophageal reflux disease without esophagitis: Secondary | ICD-10-CM

## 2022-01-14 DIAGNOSIS — L409 Psoriasis, unspecified: Secondary | ICD-10-CM | POA: Diagnosis not present

## 2022-01-14 DIAGNOSIS — R7303 Prediabetes: Secondary | ICD-10-CM

## 2022-01-14 DIAGNOSIS — Z1231 Encounter for screening mammogram for malignant neoplasm of breast: Secondary | ICD-10-CM | POA: Diagnosis not present

## 2022-01-14 DIAGNOSIS — Z716 Tobacco abuse counseling: Secondary | ICD-10-CM

## 2022-01-14 DIAGNOSIS — Z0001 Encounter for general adult medical examination with abnormal findings: Secondary | ICD-10-CM

## 2022-01-14 DIAGNOSIS — E785 Hyperlipidemia, unspecified: Secondary | ICD-10-CM

## 2022-01-14 HISTORY — DX: Other specified noninflammatory disorders of vagina: N89.8

## 2022-01-14 LAB — COMPREHENSIVE METABOLIC PANEL
ALT: 13 U/L (ref 0–35)
AST: 15 U/L (ref 0–37)
Albumin: 4.2 g/dL (ref 3.5–5.2)
Alkaline Phosphatase: 78 U/L (ref 39–117)
BUN: 12 mg/dL (ref 6–23)
CO2: 31 mEq/L (ref 19–32)
Calcium: 9.6 mg/dL (ref 8.4–10.5)
Chloride: 104 mEq/L (ref 96–112)
Creatinine, Ser: 0.74 mg/dL (ref 0.40–1.20)
GFR: 86.61 mL/min (ref >60.00)
Glucose, Bld: 126 mg/dL — ABNORMAL HIGH (ref 70–99)
Potassium: 3.8 mEq/L (ref 3.5–5.1)
Sodium: 139 mEq/L (ref 135–145)
Total Bilirubin: 0.4 mg/dL (ref 0.2–1.2)
Total Protein: 7.1 g/dL (ref 6.0–8.3)

## 2022-01-14 LAB — LIPID PANEL
Cholesterol: 160 mg/dL (ref 0–200)
HDL: 66.1 mg/dL (ref >39.00)
LDL Cholesterol: 81 mg/dL (ref 0–99)
NonHDL: 94.33
Total CHOL/HDL Ratio: 2
Triglycerides: 66 mg/dL (ref 0.0–149.0)
VLDL: 13.2 mg/dL (ref 0.0–40.0)

## 2022-01-14 LAB — HEMOGLOBIN A1C: Hgb A1c MFr Bld: 6.1 % (ref 4.6–6.5)

## 2022-01-14 NOTE — Assessment & Plan Note (Addendum)
Immunizations up-to-date. Pap smear up-to-date, due early next year. Colon cancer screening up-to-date, Cologuard due again in 2024. Mammogram overdue and pending.  Encouraged healthy diet regular exercise.  Exam today as noted. Labs pending.

## 2022-01-14 NOTE — Assessment & Plan Note (Signed)
Repeat lipid panel pending. Continue rosuvastatin 10 mg daily. 

## 2022-01-14 NOTE — Assessment & Plan Note (Signed)
Above goal today, home readings are slightly lower.  She is motivated to work on her lifestyle.  Continue amlodipine 10 mg daily.  She will continue to monitor.

## 2022-01-14 NOTE — Progress Notes (Signed)
Subjective:    Patient ID: Jenna Graham, female    DOB: 22-Apr-1961, 61 y.o.   MRN: [id number]  HPI  Jenna Graham is a very pleasant 61 y.o. female who presents today for complete physical and follow up of chronic conditions.  She would also like to discuss chronic vaginal itching and rash for which she's noticed since last Summer after having Covid-19. She's tried monostat, vagisil, and benadryl with temporary improvement. She denies vaginal discharge, vaginal dryness, dysuria, hematuria, increased urinary frequency.   Immunizations: -Tetanus: 2020 -Influenza: Completed this season  -Covid-19: 4 vaccines -Shingles: Completed Shingrix -Pneumonia: 2020  Diet: Fair diet. Working on her diet.  Exercise: 10,000 steps daily, 6 days weekly   Eye exam: Completes annually, due  Dental exam: Completes semi-annually   Pap Smear: Completed in November 2020 Mammogram: Completed in  Colonoscopy: Completed Cologuard in February 2021, negative.  Due 2024.  Lung Cancer Screening: Completed in September 2022  She checks her BP at home and will see readings of 130's/70's.   BP Readings from Last 3 Encounters:  01/14/22 138/84  12/26/20 140/86  04/11/20 (!) 148/76       Review of Systems  Constitutional:  Negative for unexpected weight change.  HENT:  Negative for rhinorrhea.   Eyes:  Negative for visual disturbance.  Respiratory:  Negative for cough and shortness of breath.   Cardiovascular:  Negative for chest pain.  Gastrointestinal:  Negative for constipation and diarrhea.  Genitourinary:  Negative for difficulty urinating, dysuria, frequency, hematuria, vaginal bleeding and vaginal discharge.       Vaginal itching and rash  Musculoskeletal:  Positive for arthralgias and back pain.  Skin:  Negative for rash.  Allergic/Immunologic: Negative for environmental allergies.  Neurological:  Negative for dizziness, numbness and headaches.  Psychiatric/Behavioral:  The patient is not  nervous/anxious.         Past Medical History:  Diagnosis Date   GERD (gastroesophageal reflux disease)     Social History   Socioeconomic History   Marital status: Married    Spouse name: Not on file   Number of children: Not on file   Years of education: Not on file   Highest education level: Not on file  Occupational History   Not on file  Tobacco Use   Smoking status: Every Day    Packs/day: 1.00    Types: Cigarettes   Smokeless tobacco: Never  Substance and Sexual Activity   Alcohol use: Yes   Drug use: Not on file   Sexual activity: Not on file  Other Topics Concern   Not on file  Social History Narrative   Married.   1 child.   Works as a Pharmacist, hospital.   Enjoys farming, shows dogs.   Social Determinants of Health   Financial Resource Strain: Not on file  Food Insecurity: Not on file  Transportation Needs: Not on file  Physical Activity: Not on file  Stress: Not on file  Social Connections: Not on file  Intimate Partner Violence: Not on file    Past Surgical History:  Procedure Laterality Date   VARICOSE VEIN SURGERY      Family History  Problem Relation Age of Onset   Melanoma Mother    Appendicitis Mother        Cancerous   Pulmonary embolism Father    Hypertension Father    Ovarian cancer Maternal Grandmother    Multiple myeloma Maternal Grandfather     No Known Allergies  Current  Outpatient Medications on File Prior to Visit  Medication Sig Dispense Refill   amLODipine (NORVASC) 10 MG tablet TAKE 1 TABLET BY MOUTH EVERY DAY FOR BLOOD PRESSURE. Office visit required for further refills. 30 tablet 0   Betamethasone Dipropionate 0.05 % EMUL Apply topically daily as needed.     esomeprazole (NEXIUM) 20 MG packet Take 20 mg by mouth daily before breakfast.     rosuvastatin (CRESTOR) 10 MG tablet Take 1 tablet (10 mg total) by mouth every evening. For cholesterol. Office visit required for further refills. 30 tablet 0   hydrocortisone 2.5 % cream  Apply topically.     No current facility-administered medications on file prior to visit.    BP 138/84    Pulse 82    Temp 98.6 F (37 C) (Oral)    SpO2 97%  Objective:   Physical Exam Exam conducted with a chaperone present.  HENT:     Right Ear: Tympanic membrane and ear canal normal.     Left Ear: Tympanic membrane and ear canal normal.     Nose: Nose normal.  Eyes:     Conjunctiva/sclera: Conjunctivae normal.     Pupils: Pupils are equal, round, and reactive to light.  Neck:     Thyroid: No thyromegaly.  Cardiovascular:     Rate and Rhythm: Normal rate and regular rhythm.     Heart sounds: No murmur heard. Pulmonary:     Effort: Pulmonary effort is normal.     Breath sounds: Normal breath sounds. No rales.  Abdominal:     General: Bowel sounds are normal.     Palpations: Abdomen is soft.     Tenderness: There is no abdominal tenderness.  Genitourinary:    Labia:        Right: Rash present.        Left: Rash present.      Vagina: Erythema present. No vaginal discharge.     Cervix: No discharge or erythema.     Comments: Erythema and small abrasions to bilateral inner labia majora Musculoskeletal:        General: Normal range of motion.     Cervical back: Neck supple.  Lymphadenopathy:     Cervical: No cervical adenopathy.  Skin:    General: Skin is warm and dry.     Findings: No rash.  Neurological:     Mental Status: She is alert and oriented to person, place, and time.     Cranial Nerves: No cranial nerve deficit.     Deep Tendon Reflexes: Reflexes are normal and symmetric.  Psychiatric:        Mood and Affect: Mood normal.          Assessment & Plan:      This visit occurred during the SARS-CoV-2 public health emergency.  Safety protocols were in place, including screening questions prior to the visit, additional usage of staff PPE, and extensive cleaning of exam room while observing appropriate contact time as indicated for disinfecting solutions.

## 2022-01-14 NOTE — Assessment & Plan Note (Signed)
Chronic and stable.  Following with dermatology. Continue betamethasone dipropionate 0.05%.

## 2022-01-14 NOTE — Assessment & Plan Note (Signed)
Discussed the importance of a healthy diet and regular exercise in order for weight loss, and to reduce the risk of further co-morbidity.  Repeat A1c pending. 

## 2022-01-14 NOTE — Assessment & Plan Note (Signed)
Chronic.  Exam today with obvious irritation that could be representative of lichen sclerosis.  Other differentials include yeast or bacterial infection, vaginal atrophy.  Wet prep completed today.  Consider clobetasol 0.05% cream if labs are negative.

## 2022-01-14 NOTE — Assessment & Plan Note (Signed)
Controlled.  Continue Nexium 20 mg daily.  

## 2022-01-14 NOTE — Assessment & Plan Note (Signed)
She is working to quit, will be undergoing a program soon.  Lung cancer screening UTD and reviewed.

## 2022-01-14 NOTE — Patient Instructions (Signed)
Stop by the lab prior to leaving today. I will notify you of your results once received.   [facility] to schedule your mammogram.   It was a pleasure to see you today!  Preventive Care 58-63 Years Old, Female Preventive care refers to lifestyle choices and visits with your health care provider that can promote health and wellness. Preventive care visits are also called wellness exams. What can I expect for my preventive care visit? Counseling Your health care provider may ask you questions about your: Medical history, including: Past medical problems. Family medical history. Pregnancy history. Current health, including: Menstrual cycle. Method of birth control. Emotional well-being. Home life and relationship well-being. Sexual activity and sexual health. Lifestyle, including: Alcohol, nicotine or tobacco, and drug use. Access to firearms. Diet, exercise, and sleep habits. Work and work Statistician. Sunscreen use. Safety issues such as seatbelt and bike helmet use. Physical exam Your health care provider will check your: Height and weight. These may be used to calculate your BMI (body mass index). BMI is a measurement that tells if you are at a healthy weight. Waist circumference. This measures the distance around your waistline. This measurement also tells if you are at a healthy weight and may help predict your risk of certain diseases, such as type 2 diabetes and high blood pressure. Heart rate and blood pressure. Body temperature. Skin for abnormal spots. What immunizations do I need? Vaccines are usually given at various ages, according to a schedule. Your health care provider will recommend vaccines for you based on your age, medical history, and lifestyle or other factors, such as travel or where you work. What tests do I need? Screening Your health care provider may recommend screening tests for certain conditions. This may include: Lipid and cholesterol  levels. Diabetes screening. This is done by checking your blood sugar (glucose) after you have not eaten for a while (fasting). Pelvic exam and Pap test. Hepatitis B test. Hepatitis C test. HIV (human immunodeficiency virus) test. STI (sexually transmitted infection) testing, if you are at risk. Lung cancer screening. Colorectal cancer screening. Mammogram. Talk with your health care provider about when you should start having regular mammograms. This may depend on whether you have a family history of breast cancer. BRCA-related cancer screening. This may be done if you have a family history of breast, ovarian, tubal, or peritoneal cancers. Bone density scan. This is done to screen for osteoporosis. Talk with your health care provider about your test results, treatment options, and if necessary, the need for more tests. Follow these instructions at home: Eating and drinking  Eat a diet that includes fresh fruits and vegetables, whole grains, lean protein, and low-fat dairy products. Take vitamin and mineral supplements as recommended by your health care provider. Do not drink alcohol if: Your health care provider tells you not to drink. You are pregnant, may be pregnant, or are planning to become pregnant. If you drink alcohol: Limit how much you have to 0-1 drink a day. Know how much alcohol is in your drink. In the [place] one drink equals one 12 oz bottle of beer (355 mL), one 5 oz glass of wine (148 mL), or one 1 oz glass of hard liquor (44 mL). Lifestyle Brush your teeth every morning and night with fluoride toothpaste. Floss one time each day. Exercise for at least 30 minutes 5 or more days each week. Do not use any products that contain nicotine or tobacco. These products include cigarettes, chewing tobacco,  and vaping devices, such as e-cigarettes. If you need help quitting, ask your health care provider. Do not use drugs. If you are sexually active, practice safe sex. Use a  condom or other form of protection to prevent STIs. If you do not wish to become pregnant, use a form of birth control. If you plan to become pregnant, see your health care provider for a prepregnancy visit. Take aspirin only as told by your health care provider. Make sure that you understand how much to take and what form to take. Work with your health care provider to find out whether it is safe and beneficial for you to take aspirin daily. Find healthy ways to manage stress, such as: Meditation, yoga, or listening to music. Journaling. Talking to a trusted person. Spending time with friends and family. Minimize exposure to UV radiation to reduce your risk of skin cancer. Safety Always wear your seat belt while driving or riding in a vehicle. Do not drive: If you have been drinking alcohol. Do not ride with someone who has been drinking. When you are tired or distracted. While texting. If you have been using any mind-altering substances or drugs. Wear a helmet and other protective equipment during sports activities. If you have firearms in your house, make sure you follow all gun safety procedures. Seek help if you have been physically or sexually abused. What's next? Visit your health care provider once a year for an annual wellness visit. Ask your health care provider how often you should have your eyes and teeth checked. Stay up to date on all vaccines. This information is not intended to replace advice given to you by your health care provider. Make sure you discuss any questions you have with your health care provider. Document Revised: 05/09/2021 Document Reviewed: 05/09/2021 Elsevier Patient Education  [place].

## 2022-01-15 ENCOUNTER — Other Ambulatory Visit: Payer: Self-pay | Admitting: Primary Care

## 2022-01-15 DIAGNOSIS — N898 Other specified noninflammatory disorders of vagina: Secondary | ICD-10-CM

## 2022-01-15 LAB — WET PREP BY MOLECULAR PROBE
Candida species: NOT DETECTED
Gardnerella vaginalis: NOT DETECTED
MICRO NUMBER:: 13031105
SPECIMEN QUALITY:: ADEQUATE
Trichomonas vaginosis: NOT DETECTED

## 2022-01-15 MED ORDER — CLOBETASOL PROPIONATE 0.05 % EX CREA: 1.0000 "application " | TOPICAL_CREAM | Freq: Every day | CUTANEOUS | 0 refills | Status: DC | PRN

## 2022-04-01 ENCOUNTER — Other Ambulatory Visit: Payer: Self-pay | Admitting: Primary Care

## 2022-04-01 DIAGNOSIS — N898 Other specified noninflammatory disorders of vagina: Secondary | ICD-10-CM

## 2022-08-02 ENCOUNTER — Other Ambulatory Visit: Payer: Self-pay | Admitting: Primary Care

## 2022-08-02 DIAGNOSIS — N898 Other specified noninflammatory disorders of vagina: Secondary | ICD-10-CM

## 2022-08-09 ENCOUNTER — Ambulatory Visit
Admission: RE | Admit: 2022-08-09 | Discharge: 2022-08-09 | Disposition: A | Discharge status: Home / Self Care | Payer: BC Managed Care – PPO | Source: Ambulatory Visit | Attending: Acute Care | Admitting: Acute Care

## 2022-08-09 DIAGNOSIS — F17200 Nicotine dependence, unspecified, uncomplicated: Secondary | ICD-10-CM | POA: Diagnosis present

## 2022-08-09 DIAGNOSIS — Z87891 Personal history of nicotine dependence: Secondary | ICD-10-CM

## 2022-08-13 ENCOUNTER — Other Ambulatory Visit: Payer: Self-pay

## 2022-08-13 DIAGNOSIS — F17210 Nicotine dependence, cigarettes, uncomplicated: Secondary | ICD-10-CM

## 2022-08-13 DIAGNOSIS — Z122 Encounter for screening for malignant neoplasm of respiratory organs: Secondary | ICD-10-CM

## 2022-08-13 DIAGNOSIS — Z87891 Personal history of nicotine dependence: Secondary | ICD-10-CM

## 2022-10-16 ENCOUNTER — Ambulatory Visit: Payer: BC Managed Care – PPO | Admitting: Podiatry

## 2022-10-16 ENCOUNTER — Encounter: Payer: Self-pay | Admitting: Podiatry

## 2022-10-16 DIAGNOSIS — B070 Plantar wart: Secondary | ICD-10-CM

## 2022-10-16 NOTE — Patient Instructions (Signed)
Take dressing off in 8 hours and wash the foot with soap and water. If it is hurting or becomes uncomfortable before the 8 hours, go ahead and remove the bandage and wash the area.  If it blisters, apply antibiotic ointment and a band-aid.  Monitor for any signs/symptoms of infection. Call the office immediately if any occur or go directly to the emergency room. Call with any questions/concerns.   

## 2022-10-16 NOTE — Progress Notes (Signed)
  Subjective:  Patient ID: Francee Piccolo, female    DOB: 07-21-1961,  MRN: [id number]  Chief Complaint  Patient presents with   Foot Pain    np - R bump on the heel    61 y.o. female presents with the above complaint. History confirmed with patient.   Objective:  Physical Exam: warm, good capillary refill, no trophic changes or ulcerative lesions, normal DP and PT pulses, normal sensory exam, and plantar medial heel there is a deep involuting verruca plantaris painful with lateral compression.  Assessment:   1. Verruca plantaris      Plan:  Patient was evaluated and treated and all questions answered.  Discussed etiology and treatment of verruca plantaris in detail with the patient as well as multiple treatment options including blistering agents, chemotherapeutic agents, surgical excision, laser therapy and the indications and roles of the above.  Today, recommended treatment with Cantharone as noted in procedure note below.  Follow-up in 3 weeks for reevaluation  Procedure: Destruction of Lesion Location: Right plantar heel Instrumentation: 15 blade. Technique: Debridement of lesion to petechial bleeding. Aperture pad applied around lesion. Small amount of canthrone applied to the base of the lesion. Dressing: Dry, sterile, compression dressing. Disposition: Patient tolerated procedure well. Advised to leave dressing on for 6-8 hours. Thereafter patient to wash the area with soap and water and applied band-aid. Off-loading pads dispensed. Patient to return in 3 weeks for follow-up.   Return in about 3 weeks (around 11/06/2022) for wart treatment.

## 2022-11-06 ENCOUNTER — Ambulatory Visit: Payer: BC Managed Care – PPO | Admitting: Podiatry

## 2022-11-06 DIAGNOSIS — B070 Plantar wart: Secondary | ICD-10-CM

## 2022-11-06 NOTE — Progress Notes (Signed)
  Subjective:  Patient ID: Jenna Graham, female    DOB: 1961-08-22,  MRN: [id number]  No chief complaint on file.   61 y.o. female presents with the above complaint. History confirmed with patient.  The cancer blister was quite painful but is feeling better now  Objective:  Physical Exam: warm, good capillary refill, no trophic changes or ulcerative lesions, normal DP and PT pulses, normal sensory exam, and plantar medial heel there is a small area of residual verruca plantaris noted, significant improvement  Assessment:   1. Verruca plantaris      Plan:  Patient was evaluated and treated and all questions answered.  Discussed etiology and treatment of verruca plantaris in detail with the patient as well as multiple treatment options including blistering agents, chemotherapeutic agents, surgical excision, laser therapy and the indications and roles of the above.  Today, recommended treatment with salinocaine as noted in procedure note below.  Follow-up in 3-5 weeks for reevaluation.  Discussed if residual next visit we will plan to do Cantharone again, she was not able to do this today because she has an upcoming event this weekend and does not want to have the blister to deal with which I recommend as well.  In the interim she will use OTC salicylic acid 17 to 20% cream liquid or pads nightly  Procedure: Destruction of Lesion Location: Right plantar heel Instrumentation: 15 blade. Technique: Debridement of lesion to petechial bleeding. Aperture pad applied around lesion. Small amount of salinocaine applied to the base of the lesion. Dressing: Dry, sterile, compression dressing. Disposition: Patient tolerated procedure well. Advised to leave dressing on for 6-8 hours. Thereafter patient to wash the area with soap and water and applied band-aid. Off-loading pads dispensed. Patient to return in 3 weeks for follow-up.   Return in about 1 month (around 12/07/2022) for wart treatment.

## 2022-12-16 ENCOUNTER — Ambulatory Visit: Payer: BC Managed Care – PPO | Admitting: Podiatry

## 2022-12-16 DIAGNOSIS — B070 Plantar wart: Secondary | ICD-10-CM

## 2022-12-16 NOTE — Patient Instructions (Signed)
Take dressing off in 12 hours and wash the foot with soap and water. If it is hurting or becomes uncomfortable before the 12 hours, go ahead and remove the bandage and wash the area.  If it blisters, apply antibiotic ointment and a band-aid.   Monitor for any signs/symptoms of infection. Call the office immediately if any occur or go directly to the emergency room. Call with any questions/concerns.  

## 2022-12-16 NOTE — Progress Notes (Signed)
  Subjective:  Patient ID: Jenna Graham, female    DOB: Dec 14, 1960,  MRN: [id number]  Chief Complaint  Patient presents with   Plantar Warts    6 week follow up for wart treatment    62 y.o. female presents with the above complaint. History confirmed with patient.  She has been using the salicylic acid  Objective:  Physical Exam: warm, good capillary refill, no trophic changes or ulcerative lesions, normal DP and PT pulses, normal sensory exam, and plantar medial heel there is a small area of residual verruca plantaris noted, significant improvement  Assessment:   No diagnosis found.    Plan:  Patient was evaluated and treated and all questions answered.  Discussed etiology and treatment of verruca plantaris in detail with the patient as well as multiple treatment options including blistering agents, chemotherapeutic agents, surgical excision, laser therapy and the indications and roles of the above.  Today, recommended treatment with cantharone as noted in procedure note below.  Follow-up in 3-5 weeks for reevaluation.     Procedure: Destruction of Lesion Location: Right plantar heel Instrumentation: 15 blade. Technique: Debridement of lesion to petechial bleeding. Aperture pad applied around lesion. Small amount of cantharone applied to the base of the lesion. Dressing: Dry, sterile, compression dressing. Disposition: Patient tolerated procedure well. Advised to leave dressing on for 12 hours. Thereafter patient to wash the area with soap and water and applied band-aid. Off-loading pads dispensed. Patient to return in 5 weeks for follow-up.   Return in about 4 weeks (around 01/13/2023) for wart treatment.

## 2023-01-10 ENCOUNTER — Other Ambulatory Visit: Payer: Self-pay | Admitting: Primary Care

## 2023-01-10 DIAGNOSIS — E785 Hyperlipidemia, unspecified: Secondary | ICD-10-CM

## 2023-01-10 DIAGNOSIS — I10 Essential (primary) hypertension: Secondary | ICD-10-CM

## 2023-01-13 ENCOUNTER — Encounter: Payer: Self-pay | Admitting: Podiatry

## 2023-01-13 ENCOUNTER — Ambulatory Visit: Payer: BC Managed Care – PPO | Admitting: Podiatry

## 2023-01-13 VITALS — BP 157/87 | HR 72

## 2023-01-13 DIAGNOSIS — B070 Plantar wart: Secondary | ICD-10-CM

## 2023-01-13 NOTE — Progress Notes (Signed)
  Subjective:  Patient ID: Jenna Graham, female    DOB: [birth year],  MRN: [id number]  Chief Complaint  Patient presents with   Plantar Warts    "I think it's better."    62 y.o. female presents with the above complaint. History confirmed with patient.  She says it is doing well, not having any pain   Objective:  Physical Exam: warm, good capillary refill, no trophic changes or ulcerative lesions, normal DP and PT pulses, normal sensory exam, and plantar medial heel no verruca noted after removal of dead skin and blister  Assessment:   1. Verruca plantaris       Plan:  Patient was evaluated and treated and all questions answered.  Doing much better. Dead skin / blister trimmed with scalpel. No residual lesion. Advised on s/s of recurrence. Return PRN    Return if symptoms worsen or fail to improve.

## 2023-01-27 ENCOUNTER — Encounter: Payer: BC Managed Care – PPO | Admitting: Primary Care

## 2023-02-14 ENCOUNTER — Other Ambulatory Visit (HOSPITAL_COMMUNITY)
Admission: RE | Admit: 2023-02-14 | Discharge: 2023-02-14 | Disposition: A | Discharge status: Home / Self Care | Payer: BC Managed Care – PPO | Source: Ambulatory Visit | Attending: Primary Care | Admitting: Primary Care

## 2023-02-14 ENCOUNTER — Encounter: Payer: Self-pay | Admitting: Primary Care

## 2023-02-14 ENCOUNTER — Ambulatory Visit (INDEPENDENT_AMBULATORY_CARE_PROVIDER_SITE_OTHER): Payer: BC Managed Care – PPO | Admitting: Primary Care

## 2023-02-14 VITALS — BP 162/90 | HR 70 | Temp 97.9°F | Ht 66.5 in | Wt 197.0 lb

## 2023-02-14 DIAGNOSIS — Z1231 Encounter for screening mammogram for malignant neoplasm of breast: Secondary | ICD-10-CM | POA: Diagnosis not present

## 2023-02-14 DIAGNOSIS — Z0001 Encounter for general adult medical examination with abnormal findings: Secondary | ICD-10-CM

## 2023-02-14 DIAGNOSIS — K219 Gastro-esophageal reflux disease without esophagitis: Secondary | ICD-10-CM | POA: Diagnosis not present

## 2023-02-14 DIAGNOSIS — E785 Hyperlipidemia, unspecified: Secondary | ICD-10-CM | POA: Diagnosis not present

## 2023-02-14 DIAGNOSIS — Z1211 Encounter for screening for malignant neoplasm of colon: Secondary | ICD-10-CM

## 2023-02-14 DIAGNOSIS — L409 Psoriasis, unspecified: Secondary | ICD-10-CM | POA: Diagnosis not present

## 2023-02-14 DIAGNOSIS — Z124 Encounter for screening for malignant neoplasm of cervix: Secondary | ICD-10-CM

## 2023-02-14 DIAGNOSIS — R7303 Prediabetes: Secondary | ICD-10-CM | POA: Diagnosis not present

## 2023-02-14 DIAGNOSIS — I10 Essential (primary) hypertension: Secondary | ICD-10-CM

## 2023-02-14 LAB — COMPREHENSIVE METABOLIC PANEL
ALT: 15 U/L (ref 0–35)
AST: 16 U/L (ref 0–37)
Albumin: 4.2 g/dL (ref 3.5–5.2)
Alkaline Phosphatase: 75 U/L (ref 39–117)
BUN: 11 mg/dL (ref 6–23)
CO2: 30 mEq/L (ref 19–32)
Calcium: 9.5 mg/dL (ref 8.4–10.5)
Chloride: 105 mEq/L (ref 96–112)
Creatinine, Ser: 0.77 mg/dL (ref 0.40–1.20)
GFR: 81.95 mL/min (ref >60.00)
Glucose, Bld: 114 mg/dL — ABNORMAL HIGH (ref 70–99)
Potassium: 3.7 mEq/L (ref 3.5–5.1)
Sodium: 142 mEq/L (ref 135–145)
Total Bilirubin: 0.5 mg/dL (ref 0.2–1.2)
Total Protein: 7.1 g/dL (ref 6.0–8.3)

## 2023-02-14 LAB — CBC WITH DIFFERENTIAL/PLATELET
Basophils Absolute: 0.1 10*3/uL (ref 0.0–0.1)
Basophils Relative: 0.7 % (ref 0.0–3.0)
Eosinophils Absolute: 0.2 10*3/uL (ref 0.0–0.7)
Eosinophils Relative: 1.9 % (ref 0.0–5.0)
HCT: 44.9 % (ref 36.0–46.0)
Hemoglobin: 15 g/dL (ref 12.0–15.0)
Lymphocytes Relative: 29.9 % (ref 12.0–46.0)
Lymphs Abs: 2.7 10*3/uL (ref 0.7–4.0)
MCHC: 33.3 g/dL (ref 30.0–36.0)
MCV: 86.4 fl (ref 78.0–100.0)
Monocytes Absolute: 0.6 10*3/uL (ref 0.1–1.0)
Monocytes Relative: 6.7 % (ref 3.0–12.0)
Neutro Abs: 5.5 10*3/uL (ref 1.4–7.7)
Neutrophils Relative %: 60.8 % (ref 43.0–77.0)
Platelets: 294 10*3/uL (ref 150.0–400.0)
RBC: 5.19 Mil/uL — ABNORMAL HIGH (ref 3.87–5.11)
RDW: 14.5 % (ref 11.5–15.5)
WBC: 9 10*3/uL (ref 4.0–10.5)

## 2023-02-14 LAB — LIPID PANEL
Cholesterol: 152 mg/dL (ref 0–200)
HDL: 65.5 mg/dL (ref >39.00)
LDL Cholesterol: 68 mg/dL (ref 0–99)
NonHDL: 86.71
Total CHOL/HDL Ratio: 2
Triglycerides: 95 mg/dL (ref 0.0–149.0)
VLDL: 19 mg/dL (ref 0.0–40.0)

## 2023-02-14 LAB — HEMOGLOBIN A1C: Hgb A1c MFr Bld: 6.2 % (ref 4.6–6.5)

## 2023-02-14 MED ORDER — VALSARTAN 80 MG PO TABS: 80.0000 mg | ORAL_TABLET | Freq: Every day | ORAL | 0 refills | Status: DC

## 2023-02-14 NOTE — Assessment & Plan Note (Signed)
Controlled.  Continue Nexium 20 mg daily. 

## 2023-02-14 NOTE — Progress Notes (Signed)
Subjective:    Patient ID: Jenna Graham, female    DOB: [birth year], 62 y.o.   MRN: [id number]  HPI  Jenna Graham is a very pleasant 62 y.o. female who presents today for complete physical and follow up of chronic conditions.  Immunizations: -Tetanus: Completed in 2020 -Influenza: Completed this season -Shingles: Completed Shingrix series -Pneumonia: Completed pneumovax 23 in 2020  Diet: Bangs. She has been cutting back on sodas and junk food. She would like a referral to a nutritionist.  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  Eye exam: Completes annually  Dental exam: Completes semi-annually    Pap Smear: November 2020 Mammogram: Due  Colonoscopy: Completed Cologuard in February 2021, negative. Due now. Lung Cancer Screening: Completed in September 2023  BP Readings from Last 3 Encounters:  02/14/23 (!) 162/90  01/13/23 (!) 157/87  01/14/22 138/84     Review of Systems  Constitutional:  Negative for unexpected weight change.  HENT:  Negative for rhinorrhea.   Respiratory:  Negative for cough and shortness of breath.   Cardiovascular:  Negative for chest pain.  Gastrointestinal:  Negative for constipation and diarrhea.  Genitourinary:  Negative for difficulty urinating.  Musculoskeletal:  Positive for arthralgias.  Skin:  Negative for rash.  Allergic/Immunologic: Negative for environmental allergies.  Neurological:  Negative for dizziness and headaches.  Psychiatric/Behavioral:  The patient is not nervous/anxious.          Past Medical History:  Diagnosis Date   GERD (gastroesophageal reflux disease)     Social History   Socioeconomic History   Marital status: Married    Spouse name: Not on file   Number of children: Not on file   Years of education: Not on file   Highest education level: Not on file  Occupational History   Not on file  Tobacco Use   Smoking status: Every Day    Packs/day: 1    Types: Cigarettes   Smokeless  tobacco: Never  Substance and Sexual Activity   Alcohol use: Yes    Alcohol/week: 1.0 standard drink of alcohol    Types: 1 Glasses of wine per week    Comment: DAILY   Drug use: Never   Sexual activity: Not on file  Other Topics Concern   Not on file  Social History Narrative   Married.   1 child.   Works as a Pharmacist, hospital.   Enjoys farming, shows dogs.   Social Determinants of Health   Financial Resource Strain: Not on file  Food Insecurity: Not on file  Transportation Needs: Not on file  Physical Activity: Not on file  Stress: Not on file  Social Connections: Not on file  Intimate Partner Violence: Not on file    Past Surgical History:  Procedure Laterality Date   VARICOSE VEIN SURGERY      Family History  Problem Relation Age of Onset   Melanoma Mother    Appendicitis Mother        Cancerous   Pulmonary embolism Father    Hypertension Father    Ovarian cancer Maternal Grandmother    Multiple myeloma Maternal Grandfather     No Known Allergies  Current Outpatient Medications on File Prior to Visit  Medication Sig Dispense Refill   amLODipine (NORVASC) 10 MG tablet TAKE 1 TABLET BY MOUTH EVERY DAY FOR BLOOD PRESSURE 90 tablet 0   Betamethasone Dipropionate 0.05 % EMUL Apply topically daily as needed.     clobetasol cream (TEMOVATE)  0.05 % APPLY TOPICALLY DAILY AS NEEDED 30 g 0   esomeprazole (NEXIUM) 20 MG packet Take 20 mg by mouth daily before breakfast.     hydrocortisone 2.5 % cream Apply topically.     rosuvastatin (CRESTOR) 10 MG tablet TAKE 1 TABLET (10 MG TOTAL) BY MOUTH EVERY EVENING. FOR CHOLESTEROL. 90 tablet 0   No current facility-administered medications on file prior to visit.    BP (!) 162/90   Pulse 70   Temp 97.9 F (36.6 C) (Temporal)   Ht 5' 6.5" (1.689 m)   Wt 197 lb (89.4 kg)   SpO2 99%   BMI 31.32 kg/m  Objective:   Physical Exam HENT:     Right Ear: Tympanic membrane and ear canal normal.     Left Ear: Tympanic membrane and  ear canal normal.     Nose: Nose normal.  Eyes:     Conjunctiva/sclera: Conjunctivae normal.     Pupils: Pupils are equal, round, and reactive to light.  Neck:     Thyroid: No thyromegaly.  Cardiovascular:     Rate and Rhythm: Normal rate and regular rhythm.     Heart sounds: No murmur heard. Pulmonary:     Effort: Pulmonary effort is normal.     Breath sounds: Normal breath sounds. No rales.  Abdominal:     General: Bowel sounds are normal.     Palpations: Abdomen is soft.     Tenderness: There is no abdominal tenderness.  Genitourinary:    Labia:        Right: No tenderness or lesion.        Left: No tenderness or lesion.      Vagina: No vaginal discharge or erythema.     Cervix: No cervical motion tenderness or discharge.     Adnexa: Right adnexa normal and left adnexa normal.  Musculoskeletal:        General: Normal range of motion.     Cervical back: Neck supple.  Lymphadenopathy:     Cervical: No cervical adenopathy.  Skin:    General: Skin is warm and dry.     Findings: No rash.  Neurological:     Mental Status: She is alert and oriented to person, place, and time.     Cranial Nerves: No cranial nerve deficit.     Deep Tendon Reflexes: Reflexes are normal and symmetric.  Psychiatric:        Mood and Affect: Mood normal.           Assessment & Plan:  Encounter for annual general medical examination with abnormal findings in adult Assessment & Plan: Immunizations UTD. Pap smear due, completed today Mammogram due, orders placed. Colonoscopy due, she opts for Cologuard. Orders sent.   Discussed the importance of a healthy diet and regular exercise in order for weight loss, and to reduce the risk of further co-morbidity.  Exam stable. Labs pending.  Follow up in 1 year for repeat physical.    Essential hypertension Assessment & Plan: Uncontrolled today, also on recheck and also during recent podiatry visit.   Continue amlodipine 10 mg daily. Add  valsartan 80 mg daily.   We will plan to see her back in 2-3 weeks for BP check and BMP.   Orders: -     Amb ref to Medical Nutrition Therapy-MNT -     CBC with Differential/Platelet -     Valsartan; Take 1 tablet (80 mg total) by mouth daily. for blood pressure.  Dispense: 30  tablet; Refill: 0  Gastroesophageal reflux disease, unspecified whether esophagitis present Assessment & Plan: Controlled.  Continue Nexium 20 mg daily.    Psoriasis Assessment & Plan: Stable.  Continue betamethasone 0.05% PRN.   Screening mammogram for breast cancer -     3D Screening Mammogram, Left and Right; Future  Screening for colon cancer -     Cologuard  Hyperlipidemia, unspecified hyperlipidemia type Assessment & Plan: Repeat lipid panel pending.  Discussed the importance of a healthy diet and regular exercise in order for weight loss, and to reduce the risk of further co-morbidity. Continue rosuvastatin 10 mg daily.  Orders: -     Amb ref to Medical Nutrition Therapy-MNT -     Lipid panel -     Comprehensive metabolic panel  Prediabetes Assessment & Plan: Repeat A1C pending.  Discussed the importance of a healthy diet and regular exercise in order for weight loss, and to reduce the risk of further co-morbidity. Referral placed to nutritionist.   Orders: -     Amb ref to Medical Nutrition Therapy-MNT -     Hemoglobin A1c  Screening for cervical cancer -     Cytology - PAP        Pleas Koch, NP

## 2023-02-14 NOTE — Assessment & Plan Note (Signed)
Immunizations UTD. Pap smear due, completed today Mammogram due, orders placed. Colonoscopy due, she opts for Cologuard. Orders sent.   Discussed the importance of a healthy diet and regular exercise in order for weight loss, and to reduce the risk of further co-morbidity.  Exam stable. Labs pending.  Follow up in 1 year for repeat physical.

## 2023-02-14 NOTE — Patient Instructions (Signed)
We added valsartan 80 mg once daily for blood pressure. Continue taking amlodipine daily for blood pressure.  Stop by the lab prior to leaving today. I will notify you of your results once received.   Complete the Cologuard Kit once received.   Please schedule a follow up visit to meet back with me in 2-3 weeks for blood pressure check.   It was a pleasure to see you today!  Preventive Care 79-64 Years Old, Female Preventive care refers to lifestyle choices and visits with your health care provider that can promote health and wellness. Preventive care visits are also called wellness exams. What can I expect for my preventive care visit? Counseling Your health care provider may ask you questions about your: Medical history, including: Past medical problems. Family medical history. Pregnancy history. Current health, including: Menstrual cycle. Method of birth control. Emotional well-being. Home life and relationship well-being. Sexual activity and sexual health. Lifestyle, including: Alcohol, nicotine or tobacco, and drug use. Access to firearms. Diet, exercise, and sleep habits. Work and work Statistician. Sunscreen use. Safety issues such as seatbelt and bike helmet use. Physical exam Your health care provider will check your: Height and weight. These may be used to calculate your BMI (body mass index). BMI is a measurement that tells if you are at a healthy weight. Waist circumference. This measures the distance around your waistline. This measurement also tells if you are at a healthy weight and may help predict your risk of certain diseases, such as type 2 diabetes and high blood pressure. Heart rate and blood pressure. Body temperature. Skin for abnormal spots. What immunizations do I need?  Vaccines are usually given at various ages, according to a schedule. Your health care provider will recommend vaccines for you based on your age, medical history, and lifestyle or  other factors, such as travel or where you work. What tests do I need? Screening Your health care provider may recommend screening tests for certain conditions. This may include: Lipid and cholesterol levels. Diabetes screening. This is done by checking your blood sugar (glucose) after you have not eaten for a while (fasting). Pelvic exam and Pap test. Hepatitis B test. Hepatitis C test. HIV (human immunodeficiency virus) test. STI (sexually transmitted infection) testing, if you are at risk. Lung cancer screening. Colorectal cancer screening. Mammogram. Talk with your health care provider about when you should start having regular mammograms. This may depend on whether you have a family history of breast cancer. BRCA-related cancer screening. This may be done if you have a family history of breast, ovarian, tubal, or peritoneal cancers. Bone density scan. This is done to screen for osteoporosis. Talk with your health care provider about your test results, treatment options, and if necessary, the need for more tests. Follow these instructions at home: Eating and drinking  Eat a diet that includes fresh fruits and vegetables, whole grains, lean protein, and low-fat dairy products. Take vitamin and mineral supplements as recommended by your health care provider. Do not drink alcohol if: Your health care provider tells you not to drink. You are pregnant, may be pregnant, or are planning to become pregnant. If you drink alcohol: Limit how much you have to 0-1 drink a day. Know how much alcohol is in your drink. In the [place] one drink equals one 12 oz bottle of beer (355 mL), one 5 oz glass of wine (148 mL), or one 1 oz glass of hard liquor (44 mL). Lifestyle Brush your teeth every morning and  night with fluoride toothpaste. Floss one time each day. Exercise for at least 30 minutes 5 or more days each week. Do not use any products that contain nicotine or tobacco. These products include  cigarettes, chewing tobacco, and vaping devices, such as e-cigarettes. If you need help quitting, ask your health care provider. Do not use drugs. If you are sexually active, practice safe sex. Use a condom or other form of protection to prevent STIs. If you do not wish to become pregnant, use a form of birth control. If you plan to become pregnant, see your health care provider for a prepregnancy visit. Take aspirin only as told by your health care provider. Make sure that you understand how much to take and what form to take. Work with your health care provider to find out whether it is safe and beneficial for you to take aspirin daily. Find healthy ways to manage stress, such as: Meditation, yoga, or listening to music. Journaling. Talking to a trusted person. Spending time with friends and family. Minimize exposure to UV radiation to reduce your risk of skin cancer. Safety Always wear your seat belt while driving or riding in a vehicle. Do not drive: If you have been drinking alcohol. Do not ride with someone who has been drinking. When you are tired or distracted. While texting. If you have been using any mind-altering substances or drugs. Wear a helmet and other protective equipment during sports activities. If you have firearms in your house, make sure you follow all gun safety procedures. Seek help if you have been physically or sexually abused. What's next? Visit your health care provider once a year for an annual wellness visit. Ask your health care provider how often you should have your eyes and teeth checked. Stay up to date on all vaccines. This information is not intended to replace advice given to you by your health care provider. Make sure you discuss any questions you have with your health care provider. Document Revised: 05/09/2021 Document Reviewed: 05/09/2021 Elsevier Patient Education  [place].

## 2023-02-14 NOTE — Assessment & Plan Note (Signed)
Repeat lipid panel pending.  Discussed the importance of a healthy diet and regular exercise in order for weight loss, and to reduce the risk of further co-morbidity. Continue rosuvastatin 10 mg daily. 

## 2023-02-14 NOTE — Assessment & Plan Note (Signed)
Repeat A1C pending.  Discussed the importance of a healthy diet and regular exercise in order for weight loss, and to reduce the risk of further co-morbidity. Referral placed to nutritionist.

## 2023-02-14 NOTE — Assessment & Plan Note (Signed)
Stable.  Continue betamethasone 0.05% PRN.

## 2023-02-14 NOTE — Assessment & Plan Note (Addendum)
Uncontrolled today, also on recheck and also during recent podiatry visit.   Continue amlodipine 10 mg daily. Add valsartan 80 mg daily.   We will plan to see her back in 2-3 weeks for BP check and BMP.

## 2023-02-18 LAB — CYTOLOGY - PAP
Comment: NEGATIVE
Diagnosis: NEGATIVE
High risk HPV: NEGATIVE

## 2023-02-27 ENCOUNTER — Encounter: Payer: Self-pay | Admitting: Dietician

## 2023-02-27 ENCOUNTER — Encounter: Payer: BC Managed Care – PPO | Attending: Primary Care | Admitting: Dietician

## 2023-02-27 NOTE — Progress Notes (Signed)
Medical Nutrition Therapy: Visit start time: 1050  end time: [id number]  Assessment:   Referral Diagnosis: prediabetes, HTN, hyperlipidemia Other medical history/ diagnoses: obesity, GERD Psychosocial issues/ stress concerns: none  Medications, supplements: reconciled list in medical record   Preferred learning method:  Visual Hands-on   Current weight: 200.9lbs Height: 5'6" BMI: 32.43 Patient's personal weight goal: 155lbs  Progress and evaluation:  Patient reports trying intermittent fasting and Mediterranean style eating pattern. Has eliminated cured meats, fast foods, reduced sodas She voices frustration with lack of weight loss despite making changes and staying active daily. She is limiting sodium intake by avoiding most processed foods, limiting salt in cooking and at the table. Typically avoids pork products and chooses extra lean beef; most animal proteins are poultry, some fish and shrimp, some meatless meals.  Retired from teaching about 1 month ago, and activity has increased since then. Has swimming pool at home which she uses daily throughout the summer  Avoids aspartame due to migraine trigger, also avoids other sweeteners  Food allergies: none Special diet practices: none Patient seeks help with weight loss to reduce health risk Next PCP appt is 02/28/23   Dietary Intake:  Usual eating pattern includes 2-3 meals and 0-1 snacks per day. Dining out frequency: 2 meals per week. Usually eats 1/2 of meal Who plans meals/ buys groceries? self Who prepares meals? self  Breakfast: 2 eggs poached with toast bacon once a week uncured; oatmeal with blueberries Snack: none Lunch: skips if having breakfast; beans and cheese in wrap; homemade sourdough bread with hummus and avocado; egg salad sandwich Snack: none Supper: chicken/ turkey/ beef (now grass fed)/shrimp or occ fresh fish/ usually no pork + 1/2 baked potato with sour cream, no butter/ occ rice/ pasta/  bread (one starch per  meal) + salad Snack: occ homemade hot choc  Beverages: water, coffee with 1/2 tsp sugar, tea with less sugar/ honey and lemon, 8oz soda; 1 glass wine + 1 nonalcoholic beer or nonalcoholic wine  Physical activity: walking, swimming, farming, gardening daily, duration varies   Intervention:   Nutrition Care Education:   Basic nutrition: basic food groups; appropriate nutrient balance; appropriate meal and snack schedule; general nutrition guidelines    Weight control: determining reasonable weight loss rate; importance of low sugar and low fat choices, including limiting sugar in beverages; appropriate food portions; estimated energy needs for weight loss at 1400 kcal, provided guidance for 45%CHO, 25% pro, 30% fat Prediabetes:  goals for BGs; appropriate meal and snack schedule; appropriate carb intake and balance, healthy carb choices; role of fiber, protein, fat; physical activity; effects of stress Hypertension:  identifying food sources of potassium, magnesium; options for seasoning foods Hyperlipidemia: healthy and unhealthy fats   Other intervention notes: Patient has been making mostly healthy food choices and controlling portions; addition of 3rd meal or snack to ensure adequate energy for activity during the day might help with blood sugar and weight control. Established nutrition goals with direction from patient.   Nutritional Diagnosis:  [place]-2.1 Inpaired nutrition utilization and [place]-2.2 Altered nutrition-related laboratory As related to prediabetes, hypertension, hyperlipidemia.  As evidenced by elevated HbA1C, blood pressure and blood lipids controlled with medications. [place]-3.3 Overweight/obesity As related to history of excess calories.  As evidenced by patient with current BMI of 32.43, making diet and lifestyle changes to promote weight loss.   Education Materials given:  Museum/gallery conservator with food lists, sample meal pattern Visit summary with goals/ instructions to be viewed  in MyChart  Learner/ who was taught:  Patient   Level of understanding: Verbalizes/ demonstrates competency  Demonstrated degree of understanding via:   Teach back Learning barriers: None  Willingness to learn/ readiness for change: Eager, change in progress   Monitoring and Evaluation:  Dietary intake, exercise, blood sugar control, blood pressure, blood lipids, and body weight      follow up:  05/19/23 at 1:15pm

## 2023-02-27 NOTE — Patient Instructions (Addendum)
Plan for 3 meals daily, eating something every 3-5 hours, especially when active. Food including some carbohydrates, is needed to fuel the activity. Limit drinks sweetened with sugar, continue to decrease the amount of sugar used and/or the number of sweetened drinks daily. Monitor current daily sugar intake and decrease as able. Add vegetables and some fruits for potassium, magnesium to help with blood pressure, blood sugar, and cholesterol.

## 2023-02-28 ENCOUNTER — Encounter: Payer: Self-pay | Admitting: Primary Care

## 2023-02-28 ENCOUNTER — Ambulatory Visit: Payer: BC Managed Care – PPO | Admitting: Primary Care

## 2023-02-28 VITALS — BP 144/82 | HR 80 | Temp 98.1°F

## 2023-02-28 DIAGNOSIS — I10 Essential (primary) hypertension: Secondary | ICD-10-CM

## 2023-02-28 LAB — BASIC METABOLIC PANEL
BUN: 11 mg/dL (ref 6–23)
CO2: 25 mEq/L (ref 19–32)
Calcium: 9.3 mg/dL (ref 8.4–10.5)
Chloride: 107 mEq/L (ref 96–112)
Creatinine, Ser: 0.69 mg/dL (ref 0.40–1.20)
GFR: 92.18 mL/min (ref >60.00)
Glucose, Bld: 108 mg/dL — ABNORMAL HIGH (ref 70–99)
Potassium: 3.8 mEq/L (ref 3.5–5.1)
Sodium: 140 mEq/L (ref 135–145)

## 2023-02-28 MED ORDER — VALSARTAN 160 MG PO TABS: 160.0000 mg | ORAL_TABLET | Freq: Every day | ORAL | 0 refills | Status: DC

## 2023-02-28 NOTE — Patient Instructions (Signed)
We increased the dose of your valsartan to 160 mg daily. Continue amlodipine.  Stop by the lab prior to leaving today. I will notify you of your results once received.   Send me BP readings in 2 weeks via MyChart.  It was a pleasure to see you today!

## 2023-02-28 NOTE — Progress Notes (Signed)
Subjective:    Patient ID: Francee Piccolo, female    DOB: 10/03/1961, 62 y.o.   MRN: [id number]  HPI  TAKIA MONTEL is a very pleasant 62 y.o. female with a history of hypertension, tobacco use, hyperlipidemia, prediabetes who presents today for follow up of hypertension.  She was last evaluated a few weeks ago for her annual physical. Blood pressure was noted to be above goal during several recent office visits. Given this we continued her amlodipine 10 mg and added valsartan 80 mg daily. She is here for follow up today.  Since her last visit she's compliant to amlodipine 10 mg and valsartan 80 mg daily. She is checking her BP at home which is running in the low 140's/80's. She denies dizziness and headaches.   BP Readings from Last 3 Encounters:  02/28/23 (!) 142/84  02/14/23 (!) 162/90  01/13/23 (!) 157/87      Review of Systems  Respiratory:  Negative for shortness of breath.   Cardiovascular:  Negative for chest pain.  Neurological:  Negative for dizziness and headaches.         Past Medical History:  Diagnosis Date   GERD (gastroesophageal reflux disease)     Social History   Socioeconomic History   Marital status: Married    Spouse name: Not on file   Number of children: Not on file   Years of education: Not on file   Highest education level: Not on file  Occupational History   Not on file  Tobacco Use   Smoking status: Every Day    Packs/day: .5    Types: Cigarettes   Smokeless tobacco: Never  Substance and Sexual Activity   Alcohol use: Yes    Alcohol/week: 1.0 standard drink of alcohol    Types: 1 Glasses of wine per week    Comment: 1-2 DAILY   Drug use: Never   Sexual activity: Not on file  Other Topics Concern   Not on file  Social History Narrative   Married.   1 child.   Works as a Runner, broadcasting/film/video.   Enjoys farming, shows dogs.   Social Determinants of Health   Financial Resource Strain: Not on file  Food Insecurity: Not on file   Transportation Needs: Not on file  Physical Activity: Not on file  Stress: Not on file  Social Connections: Not on file  Intimate Partner Violence: Not on file    Past Surgical History:  Procedure Laterality Date   VARICOSE VEIN SURGERY      Family History  Problem Relation Age of Onset   Melanoma Mother    Appendicitis Mother        Cancerous   Pulmonary embolism Father    Hypertension Father    Ovarian cancer Maternal Grandmother    Multiple myeloma Maternal Grandfather     No Known Allergies  Current Outpatient Medications on File Prior to Visit  Medication Sig Dispense Refill   amLODipine (NORVASC) 10 MG tablet TAKE 1 TABLET BY MOUTH EVERY DAY FOR BLOOD PRESSURE 90 tablet 0   Betamethasone Dipropionate 0.05 % EMUL Apply topically daily as needed.     BIOTIN PO Take by mouth.     clobetasol cream (TEMOVATE) 0.05 % APPLY TOPICALLY DAILY AS NEEDED 30 g 0   esomeprazole (NEXIUM) 20 MG packet Take 20 mg by mouth daily before breakfast.     hydrocortisone 2.5 % cream Apply topically.     Multiple Vitamin (MULTIVITAMIN) tablet Take 1 tablet by  mouth daily.     rosuvastatin (CRESTOR) 10 MG tablet TAKE 1 TABLET (10 MG TOTAL) BY MOUTH EVERY EVENING. FOR CHOLESTEROL. 90 tablet 0   No current facility-administered medications on file prior to visit.    BP (!) 142/84   Pulse 80   Temp 98.1 F (36.7 C) (Temporal)   SpO2 98%  Objective:   Physical Exam Cardiovascular:     Rate and Rhythm: Normal rate and regular rhythm.  Pulmonary:     Effort: Pulmonary effort is normal.     Breath sounds: Normal breath sounds.  Musculoskeletal:     Cervical back: Neck supple.  Skin:    General: Skin is warm and dry.           Assessment & Plan:  Essential hypertension Assessment & Plan: Improved but not at goal.  Continue amlodipine 10 mg daily. Increase valsartan to 160 mg daily.  BMP pending.  She will send BP readings via MyChart in 2 weeks.  Orders: -      Valsartan; Take 1 tablet (160 mg total) by mouth daily. for blood pressure.  Dispense: 90 tablet; Refill: 0 -     Basic metabolic panel        Doreene NestKatherine K Mar Zettler, NP

## 2023-02-28 NOTE — Assessment & Plan Note (Signed)
Improved but not at goal.  Continue amlodipine 10 mg daily. Increase valsartan to 160 mg daily.  BMP pending.  She will send BP readings via MyChart in 2 weeks.

## 2023-03-01 LAB — COLOGUARD: COLOGUARD: NEGATIVE

## 2023-04-09 ENCOUNTER — Other Ambulatory Visit: Payer: Self-pay | Admitting: Primary Care

## 2023-04-09 DIAGNOSIS — E785 Hyperlipidemia, unspecified: Secondary | ICD-10-CM

## 2023-04-09 DIAGNOSIS — I10 Essential (primary) hypertension: Secondary | ICD-10-CM

## 2023-05-19 ENCOUNTER — Ambulatory Visit: Payer: BC Managed Care – PPO | Admitting: Dietician

## 2023-05-28 ENCOUNTER — Other Ambulatory Visit: Payer: Self-pay | Admitting: Primary Care

## 2023-05-28 DIAGNOSIS — I10 Essential (primary) hypertension: Secondary | ICD-10-CM

## 2023-05-28 MED ORDER — VALSARTAN 160 MG PO TABS
160.0000 mg | ORAL_TABLET | Freq: Every day | ORAL | 1 refills | Status: DC
Start: 2023-05-28 — End: 2023-11-23

## 2023-05-28 NOTE — Telephone Encounter (Signed)
From: Francee Piccolo To: Office of Doreene Nest, NP Sent: 05/27/2023 10:07 AM EDT Subject: Medication Renewal Request  Refills have been requested for the following medications:   valsartan (DIOVAN) 160 MG tablet [Rhoda Waldvogel K Jonnatan Hanners]  Preferred pharmacy: [id number] - [place], [place] - [address] Delivery method: Daryll Drown

## 2023-06-25 ENCOUNTER — Encounter: Payer: Self-pay | Admitting: Dietician

## 2023-06-25 NOTE — Progress Notes (Signed)
Have not heard back from patient to reschedule her cancelled appointment from 05/19/23. Sent notification to referring provider.

## 2023-07-09 ENCOUNTER — Other Ambulatory Visit: Payer: Self-pay | Admitting: Acute Care

## 2023-07-09 DIAGNOSIS — Z122 Encounter for screening for malignant neoplasm of respiratory organs: Secondary | ICD-10-CM

## 2023-07-09 DIAGNOSIS — Z87891 Personal history of nicotine dependence: Secondary | ICD-10-CM

## 2023-07-09 DIAGNOSIS — F17210 Nicotine dependence, cigarettes, uncomplicated: Secondary | ICD-10-CM

## 2023-08-11 ENCOUNTER — Ambulatory Visit: Payer: BC Managed Care – PPO

## 2023-09-01 ENCOUNTER — Other Ambulatory Visit: Payer: Self-pay | Admitting: Primary Care

## 2023-09-01 DIAGNOSIS — N898 Other specified noninflammatory disorders of vagina: Secondary | ICD-10-CM

## 2023-09-01 MED ORDER — CLOBETASOL PROPIONATE 0.05 % EX CREA
TOPICAL_CREAM | Freq: Two times a day (BID) | CUTANEOUS | 0 refills | Status: DC | PRN
Start: 2023-09-01 — End: 2024-01-01

## 2023-10-20 ENCOUNTER — Ambulatory Visit: Payer: BC Managed Care – PPO

## 2023-11-22 ENCOUNTER — Other Ambulatory Visit: Payer: Self-pay | Admitting: Primary Care

## 2023-11-22 DIAGNOSIS — I10 Essential (primary) hypertension: Secondary | ICD-10-CM

## 2023-11-23 NOTE — Telephone Encounter (Signed)
Patient is due for CPE/follow up in late March 2025, this will be required prior to any further refills.  Please schedule, thank you!

## 2023-11-24 ENCOUNTER — Other Ambulatory Visit: Payer: Self-pay

## 2023-11-24 NOTE — Telephone Encounter (Signed)
Medication has been sent into pharmacy °

## 2023-11-24 NOTE — Telephone Encounter (Signed)
Lvmtcb, sent mychart message  

## 2023-11-24 NOTE — Telephone Encounter (Signed)
Copied from CRM [id number]. Topic: Clinical - Medication Refill >> Nov 24, 2023  8:37 AM Almira Coaster wrote: Most Recent Primary Care Visit:  Provider: Doreene Nest  Department: [facility]  Visit Type: OFFICE VISIT  Date: 02/28/2023  Medication: valsartan (DIOVAN) 160 MG tablet.   Has the patient contacted their pharmacy? No, per the office patient will need to schedule their physical prior to approving a refill. Patient has been scheduled for March 25,2025.  (Agent: If no, request that the patient contact the pharmacy for the refill. If patient does not wish to contact the pharmacy document the reason why and proceed with request.) (Agent: If yes, when and what did the pharmacy advise?)  Is this the correct pharmacy for this prescription? Yes If no, delete pharmacy and type the correct one.  This is the patient's preferred pharmacy:  [phone], [place] - [address] [place] [address] Phone: [phone] Fax: [phone]   Has the prescription been filled recently? No  Is the patient out of the medication? No, patient only has a weeks worth.   Has the patient been seen for an appointment in the last year OR does the patient have an upcoming appointment? Yes  Can we respond through MyChart? Yes  Agent: Please be advised that Rx refills may take up to 3 business days. We ask that you follow-up with your pharmacy.

## 2023-12-11 ENCOUNTER — Ambulatory Visit: Admission: RE | Admit: 2023-12-11 | Payer: Self-pay | Source: Ambulatory Visit

## 2023-12-31 ENCOUNTER — Other Ambulatory Visit: Payer: Self-pay | Admitting: Primary Care

## 2023-12-31 DIAGNOSIS — N898 Other specified noninflammatory disorders of vagina: Secondary | ICD-10-CM

## 2024-01-02 ENCOUNTER — Other Ambulatory Visit: Payer: Self-pay | Admitting: Primary Care

## 2024-01-02 DIAGNOSIS — E785 Hyperlipidemia, unspecified: Secondary | ICD-10-CM

## 2024-01-02 DIAGNOSIS — I10 Essential (primary) hypertension: Secondary | ICD-10-CM

## 2024-01-08 ENCOUNTER — Ambulatory Visit
Admission: RE | Admit: 2024-01-08 | Discharge: 2024-01-08 | Disposition: A | Discharge status: Home / Self Care | Payer: 59 | Source: Ambulatory Visit | Attending: Primary Care | Admitting: Primary Care

## 2024-01-08 DIAGNOSIS — Z1231 Encounter for screening mammogram for malignant neoplasm of breast: Secondary | ICD-10-CM | POA: Insufficient documentation

## 2024-01-12 ENCOUNTER — Ambulatory Visit: Admission: RE | Admit: 2024-01-12 | Payer: Self-pay | Source: Ambulatory Visit

## 2024-02-17 ENCOUNTER — Encounter: Payer: Self-pay | Admitting: Primary Care

## 2024-02-17 ENCOUNTER — Ambulatory Visit (INDEPENDENT_AMBULATORY_CARE_PROVIDER_SITE_OTHER): Payer: BC Managed Care – PPO | Admitting: Primary Care

## 2024-02-17 VITALS — BP 134/80 | HR 82 | Temp 97.3°F | Ht 66.0 in | Wt 200.0 lb

## 2024-02-17 DIAGNOSIS — K219 Gastro-esophageal reflux disease without esophagitis: Secondary | ICD-10-CM

## 2024-02-17 DIAGNOSIS — L409 Psoriasis, unspecified: Secondary | ICD-10-CM

## 2024-02-17 DIAGNOSIS — I10 Essential (primary) hypertension: Secondary | ICD-10-CM

## 2024-02-17 DIAGNOSIS — J439 Emphysema, unspecified: Secondary | ICD-10-CM | POA: Insufficient documentation

## 2024-02-17 DIAGNOSIS — I2510 Atherosclerotic heart disease of native coronary artery without angina pectoris: Secondary | ICD-10-CM | POA: Insufficient documentation

## 2024-02-17 DIAGNOSIS — E785 Hyperlipidemia, unspecified: Secondary | ICD-10-CM

## 2024-02-17 DIAGNOSIS — R7303 Prediabetes: Secondary | ICD-10-CM | POA: Diagnosis not present

## 2024-02-17 DIAGNOSIS — Z0000 Encounter for general adult medical examination without abnormal findings: Secondary | ICD-10-CM

## 2024-02-17 LAB — HEMOGLOBIN A1C: Hgb A1c MFr Bld: 6.1 % (ref 4.6–6.5)

## 2024-02-17 LAB — COMPREHENSIVE METABOLIC PANEL
ALT: 17 U/L (ref 0–35)
AST: 16 U/L (ref 0–37)
Albumin: 4.2 g/dL (ref 3.5–5.2)
Alkaline Phosphatase: 76 U/L (ref 39–117)
BUN: 13 mg/dL (ref 6–23)
CO2: 30 meq/L (ref 19–32)
Calcium: 9.5 mg/dL (ref 8.4–10.5)
Chloride: 104 meq/L (ref 96–112)
Creatinine, Ser: 0.71 mg/dL (ref 0.40–1.20)
GFR: 89.7 mL/min (ref >60.00)
Glucose, Bld: 104 mg/dL — ABNORMAL HIGH (ref 70–99)
Potassium: 4.3 meq/L (ref 3.5–5.1)
Sodium: 140 meq/L (ref 135–145)
Total Bilirubin: 0.4 mg/dL (ref 0.2–1.2)
Total Protein: 6.5 g/dL (ref 6.0–8.3)

## 2024-02-17 LAB — LIPID PANEL
Cholesterol: 171 mg/dL (ref 0–200)
HDL: 64.8 mg/dL (ref >39.00)
LDL Cholesterol: 93 mg/dL (ref 0–99)
NonHDL: 105.72
Total CHOL/HDL Ratio: 3
Triglycerides: 62 mg/dL (ref 0.0–149.0)
VLDL: 12.4 mg/dL (ref 0.0–40.0)

## 2024-02-17 NOTE — Assessment & Plan Note (Signed)
 Improved.   Continue amlodipine 10 mg daily, valsartan 160 mg daily. CMP pending.

## 2024-02-17 NOTE — Assessment & Plan Note (Signed)
 Repeat A1C pending.

## 2024-02-17 NOTE — Assessment & Plan Note (Signed)
 Stable. Following with dermatology   Continue clobetasol 0.05% PRN, betamethasone dipropionate 0.05% cream PRN

## 2024-02-17 NOTE — Assessment & Plan Note (Signed)
 Repeat lipid panel pending. Continue rosuvastatin 10 mg daily.

## 2024-02-17 NOTE — Assessment & Plan Note (Signed)
 Controlled.  Continue Nexium 20 mg daily.

## 2024-02-17 NOTE — Progress Notes (Signed)
 Subjective:    Patient ID: Jenna Graham, female    DOB: [birth year], 63 y.o.   MRN: [id number]  HPI  Jenna Graham is a very pleasant 63 y.o. female who presents today for complete physical and follow up of chronic conditions.  Immunizations: -Tetanus: Completed in 2020 -Influenza: Completed last season -Shingles: Completed Shingrix series -Pneumonia: Completed Pneumovax 23 in 2020  Diet: Fair diet.  Exercise: No regular exercise.  Eye exam: Completes annually  Dental exam: Completes semi-annually    Pap Smear: Completed in March 2024 Mammogram: Completed in February 2025  Colonoscopy: Completed Cologuard in 2024, negative  Lung Cancer Screening: Completed in September 2023, has not completed since. She is aware.   BP Readings from Last 3 Encounters:  02/17/24 134/80  02/28/23 (!) 144/82  02/14/23 (!) 162/90       Review of Systems  Constitutional:  Negative for unexpected weight change.  HENT:  Negative for rhinorrhea.   Respiratory:  Negative for cough and shortness of breath.   Cardiovascular:  Negative for chest pain.  Gastrointestinal:  Negative for constipation and diarrhea.  Genitourinary:  Negative for difficulty urinating.  Musculoskeletal:  Negative for arthralgias and myalgias.  Skin:  Negative for rash.  Allergic/Immunologic: Negative for environmental allergies.  Neurological:  Negative for dizziness and headaches.  Psychiatric/Behavioral:  The patient is not nervous/anxious.          Past Medical History:  Diagnosis Date   GERD (gastroesophageal reflux disease)    Vaginal itching 01/14/2022    Social History   Socioeconomic History   Marital status: Married    Spouse name: Not on file   Number of children: Not on file   Years of education: Not on file   Highest education level: Not on file  Occupational History   Not on file  Tobacco Use   Smoking status: Every Day    Current packs/day: 0.50    Types: Cigarettes   Smokeless  tobacco: Never  Substance and Sexual Activity   Alcohol use: Yes    Alcohol/week: 1.0 standard drink of alcohol    Types: 1 Glasses of wine per week    Comment: 1-2 DAILY   Drug use: Never   Sexual activity: Not on file  Other Topics Concern   Not on file  Social History Narrative   Married.   1 child.   Works as a Runner, broadcasting/film/video.   Enjoys farming, shows dogs.   Social Drivers of Corporate investment banker Strain: Not on file  Food Insecurity: Not on file  Transportation Needs: Not on file  Physical Activity: Not on file  Stress: Not on file  Social Connections: Not on file  Intimate Partner Violence: Not on file    Past Surgical History:  Procedure Laterality Date   VARICOSE VEIN SURGERY      Family History  Problem Relation Age of Onset   Melanoma Mother    Appendicitis Mother        Cancerous   Pulmonary embolism Father    Hypertension Father    Ovarian cancer Maternal Grandmother    Multiple myeloma Maternal Grandfather    Breast cancer Neg Hx     No Known Allergies  Current Outpatient Medications on File Prior to Visit  Medication Sig Dispense Refill   amLODipine (NORVASC) 10 MG tablet TAKE 1 TABLET BY MOUTH EVERY DAY FOR BLOOD PRESSURE 90 tablet 0   Betamethasone Dipropionate 0.05 % EMUL Apply topically daily as needed.  BIOTIN PO Take by mouth.     clobetasol cream (TEMOVATE) 0.05 % APPLY TOPICALLY 2 TIMES DAILY AS NEEDED. 30 g 0   esomeprazole (NEXIUM) 20 MG packet Take 20 mg by mouth daily before breakfast.     hydrocortisone 2.5 % cream Apply topically.     Multiple Vitamin (MULTIVITAMIN) tablet Take 1 tablet by mouth daily.     rosuvastatin (CRESTOR) 10 MG tablet TAKE 1 TABLET (10 MG TOTAL) BY MOUTH EVERY EVENING. FOR CHOLESTEROL. 90 tablet 0   valsartan (DIOVAN) 160 MG tablet TAKE 1 TABLET (160 MG TOTAL) BY MOUTH DAILY. FOR BLOOD PRESSURE. 90 tablet 0   No current facility-administered medications on file prior to visit.    BP 134/80   Pulse 82    Temp (!) 97.3 F (36.3 C) (Temporal)   Ht 5\' 6"  (1.676 m)   Wt 200 lb (90.7 kg)   SpO2 98%   BMI 32.28 kg/m  Objective:   Physical Exam HENT:     Right Ear: Tympanic membrane and ear canal normal.     Left Ear: Tympanic membrane and ear canal normal.  Eyes:     Pupils: Pupils are equal, round, and reactive to light.  Cardiovascular:     Rate and Rhythm: Normal rate and regular rhythm.  Pulmonary:     Effort: Pulmonary effort is normal.     Breath sounds: Normal breath sounds.  Abdominal:     General: Bowel sounds are normal.     Palpations: Abdomen is soft.     Tenderness: There is no abdominal tenderness.  Musculoskeletal:        General: Normal range of motion.     Cervical back: Neck supple.  Skin:    General: Skin is warm and dry.  Neurological:     Mental Status: She is alert and oriented to person, place, and time.     Cranial Nerves: No cranial nerve deficit.     Deep Tendon Reflexes:     Reflex Scores:      Patellar reflexes are 2+ on the right side and 2+ on the left side. Psychiatric:        Mood and Affect: Mood normal.           Assessment & Plan:  Preventative health care Assessment & Plan: Immunizations UTD. Pap smear UTD. Mammogram UTD Colon cancer screening UTD, due 2027 Lung cancer screening due, she will call to schedule.   Discussed the importance of a healthy diet and regular exercise in order for weight loss, and to reduce the risk of further co-morbidity.  Exam stable. Labs pending.  Follow up in 1 year for repeat physical.    Essential hypertension Assessment & Plan: Improved.   Continue amlodipine 10 mg daily, valsartan 160 mg daily. CMP pending.   Gastroesophageal reflux disease, unspecified whether esophagitis present Assessment & Plan: Controlled.  Continue Nexium 20 mg daily.    Psoriasis Assessment & Plan: Stable. Following with dermatology   Continue clobetasol 0.05% PRN, betamethasone dipropionate 0.05%  cream PRN     Hyperlipidemia, unspecified hyperlipidemia type Assessment & Plan: Repeat lipid panel pending.  Continue rosuvastatin 10 mg daily.    Prediabetes Assessment & Plan: Repeat A1C pending.   Coronary artery disease involving native coronary artery of native heart without angina pectoris Assessment & Plan: As evidenced on CT chest for lung cancer screening protocol. Continue Crestor 10 mg daily.  Repeat lipid panel pending.  LDL goal less than 70.   Pulmonary  emphysema, unspecified emphysema type (HCC) Assessment & Plan: Stable. No concerns today.  Encouraged lung cancer screening program.  She will call to schedule for her CT scan.         Doreene Nest, NP

## 2024-02-17 NOTE — Assessment & Plan Note (Signed)
 As evidenced on CT chest for lung cancer screening protocol. Continue Crestor 10 mg daily.  Repeat lipid panel pending.  LDL goal less than 70.

## 2024-02-17 NOTE — Assessment & Plan Note (Signed)
 Stable. No concerns today.  Encouraged lung cancer screening program.  She will call to schedule for her CT scan.

## 2024-02-17 NOTE — Patient Instructions (Signed)
 Stop by the lab prior to leaving today. I will notify you of your results once received.   Call to schedule your CT for lung cancer screening.  It was a pleasure to see you today!

## 2024-02-17 NOTE — Assessment & Plan Note (Signed)
 Immunizations UTD. Pap smear UTD. Mammogram UTD Colon cancer screening UTD, due 2027 Lung cancer screening due, she will call to schedule.   Discussed the importance of a healthy diet and regular exercise in order for weight loss, and to reduce the risk of further co-morbidity.  Exam stable. Labs pending.  Follow up in 1 year for repeat physical.

## 2024-02-17 NOTE — Addendum Note (Signed)
 Addended by: Doreene Nest on: 02/17/2024 09:56 AM   Modules accepted: Orders

## 2024-02-18 ENCOUNTER — Other Ambulatory Visit: Payer: Self-pay | Admitting: Primary Care

## 2024-02-18 DIAGNOSIS — I10 Essential (primary) hypertension: Secondary | ICD-10-CM

## 2024-02-18 DIAGNOSIS — E785 Hyperlipidemia, unspecified: Secondary | ICD-10-CM

## 2024-02-19 MED ORDER — ROSUVASTATIN CALCIUM 20 MG PO TABS
20.0000 mg | ORAL_TABLET | Freq: Every day | ORAL | 3 refills | Status: DC
Start: 2024-02-19 — End: 2024-04-22

## 2024-03-27 ENCOUNTER — Other Ambulatory Visit: Payer: Self-pay | Admitting: Primary Care

## 2024-03-27 DIAGNOSIS — E785 Hyperlipidemia, unspecified: Secondary | ICD-10-CM

## 2024-03-27 DIAGNOSIS — I10 Essential (primary) hypertension: Secondary | ICD-10-CM

## 2024-04-20 ENCOUNTER — Other Ambulatory Visit: Payer: Self-pay | Admitting: Primary Care

## 2024-04-20 DIAGNOSIS — E785 Hyperlipidemia, unspecified: Secondary | ICD-10-CM

## 2024-04-22 ENCOUNTER — Other Ambulatory Visit: Payer: Self-pay | Admitting: Primary Care

## 2024-04-22 DIAGNOSIS — E785 Hyperlipidemia, unspecified: Secondary | ICD-10-CM

## 2024-04-22 MED ORDER — ROSUVASTATIN CALCIUM 20 MG PO TABS
20.0000 mg | ORAL_TABLET | Freq: Every day | ORAL | 2 refills | Status: AC
Start: 2024-04-22 — End: ?

## 2024-09-18 ENCOUNTER — Other Ambulatory Visit: Payer: Self-pay | Admitting: Primary Care

## 2024-09-18 DIAGNOSIS — N898 Other specified noninflammatory disorders of vagina: Secondary | ICD-10-CM

## 2024-12-23 ENCOUNTER — Other Ambulatory Visit: Payer: Self-pay | Admitting: Primary Care

## 2024-12-23 DIAGNOSIS — I10 Essential (primary) hypertension: Secondary | ICD-10-CM

## 2024-12-23 NOTE — Telephone Encounter (Signed)
Patient is due for CPE/follow up in early April, this will be required prior to any further refills.  Please schedule, thank you!
